# Patient Record
Sex: Female | Born: 2000 | Race: White | Hispanic: No | Marital: Single | Smoking: Never smoker
Health system: Southern US, Community
[De-identification: ages and names within clinical notes are randomized; demographics above are authoritative.]

## PROBLEM LIST (undated history)

## (undated) DIAGNOSIS — T7840XA Allergy, unspecified, initial encounter: Secondary | ICD-10-CM

## (undated) DIAGNOSIS — J45909 Unspecified asthma, uncomplicated: Secondary | ICD-10-CM

## (undated) DIAGNOSIS — L309 Dermatitis, unspecified: Secondary | ICD-10-CM

## (undated) DIAGNOSIS — Z9229 Personal history of other drug therapy: Secondary | ICD-10-CM

## (undated) DIAGNOSIS — R064 Hyperventilation: Secondary | ICD-10-CM

## (undated) HISTORY — DX: Personal history of other drug therapy: Z92.29

## (undated) HISTORY — DX: Dermatitis, unspecified: L30.9

## (undated) HISTORY — DX: Allergy, unspecified, initial encounter: T78.40XA

## (undated) HISTORY — DX: Hyperventilation: R06.4

## (undated) HISTORY — PX: WISDOM TOOTH EXTRACTION: SHX21

## (undated) HISTORY — DX: Unspecified asthma, uncomplicated: J45.909

---
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
## 2019-01-04 ENCOUNTER — Other Ambulatory Visit: Payer: Self-pay | Admitting: Surgery

## 2019-01-04 DIAGNOSIS — E041 Nontoxic single thyroid nodule: Secondary | ICD-10-CM

## 2019-01-14 ENCOUNTER — Other Ambulatory Visit (HOSPITAL_COMMUNITY)
Admission: RE | Admit: 2019-01-14 | Discharge: 2019-01-14 | Disposition: A | Discharge status: Home / Self Care | Payer: Medicaid Other | Source: Ambulatory Visit | Attending: Student | Admitting: Student

## 2019-01-14 ENCOUNTER — Ambulatory Visit
Admission: RE | Admit: 2019-01-14 | Discharge: 2019-01-14 | Disposition: A | Discharge status: Home / Self Care | Payer: Medicaid Other | Source: Ambulatory Visit | Attending: Surgery | Admitting: Surgery

## 2019-01-14 DIAGNOSIS — E041 Nontoxic single thyroid nodule: Secondary | ICD-10-CM | POA: Insufficient documentation

## 2019-02-05 ENCOUNTER — Other Ambulatory Visit: Payer: Self-pay | Admitting: Pediatrics

## 2019-02-05 DIAGNOSIS — R221 Localized swelling, mass and lump, neck: Secondary | ICD-10-CM

## 2019-02-05 DIAGNOSIS — E041 Nontoxic single thyroid nodule: Secondary | ICD-10-CM

## 2019-02-08 ENCOUNTER — Other Ambulatory Visit: Payer: Self-pay

## 2019-02-08 ENCOUNTER — Ambulatory Visit
Admission: RE | Admit: 2019-02-08 | Discharge: 2019-02-08 | Disposition: A | Discharge status: Home / Self Care | Payer: Medicaid Other | Source: Ambulatory Visit | Attending: Pediatrics | Admitting: Pediatrics

## 2019-02-08 DIAGNOSIS — E041 Nontoxic single thyroid nodule: Secondary | ICD-10-CM

## 2019-03-11 DIAGNOSIS — E079 Disorder of thyroid, unspecified: Secondary | ICD-10-CM | POA: Insufficient documentation

## 2019-03-11 IMAGING — US US THYROID
1 series · 13 of 25 positions shown · non-contrast
Comparison: None.

CLINICAL DATA: Right neck swelling

EXAM:
THYROID ULTRASOUND
TECHNIQUE: Ultrasound examination of the thyroid gland and adjacent soft
tissues was performed.

[Series 1: us thyroid · 0.04mm/px · 13 of 44 slices shown]
[im 1/44]
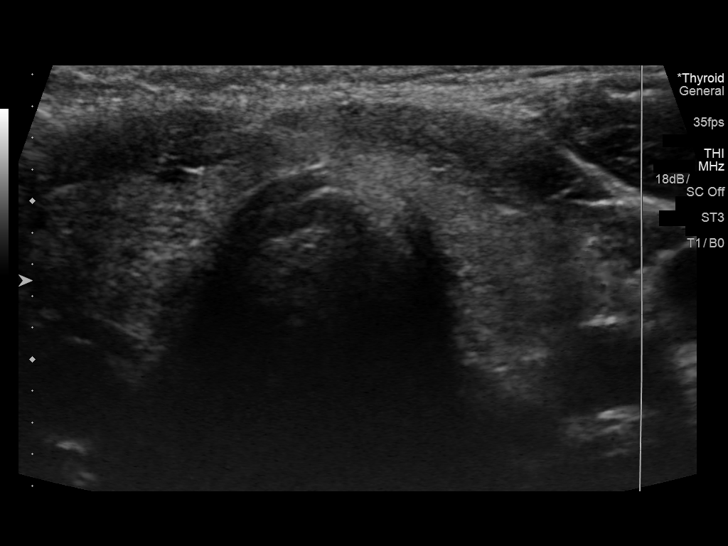
[im 4/44]
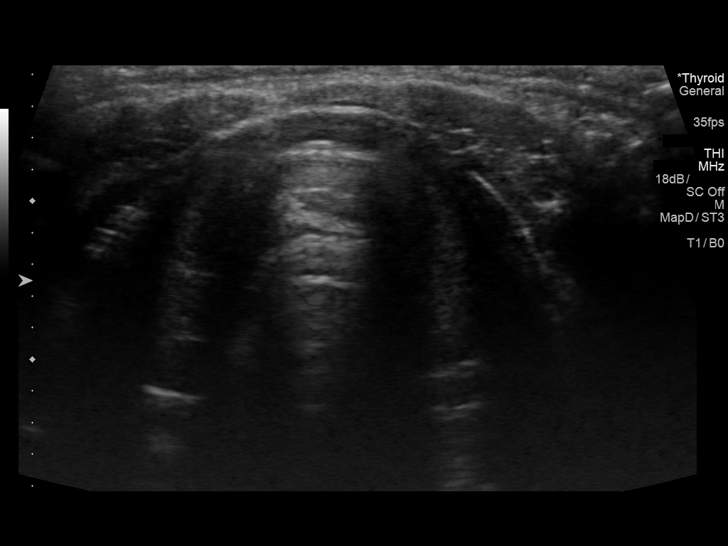
[im 8/44]
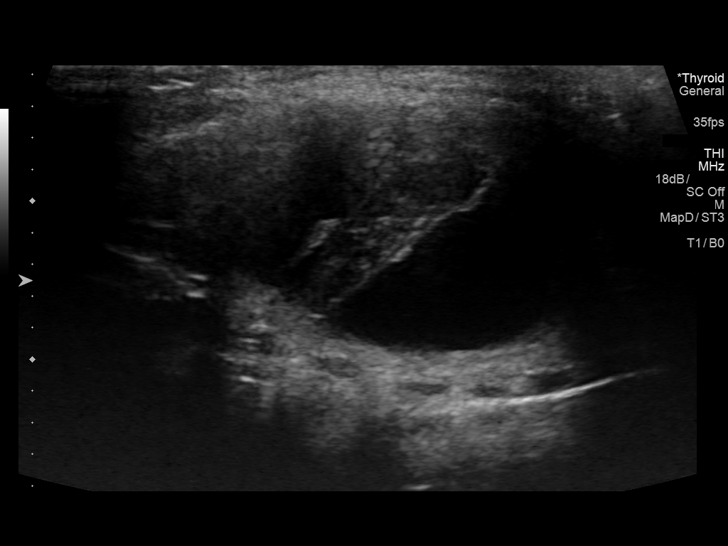
[im 11/44]
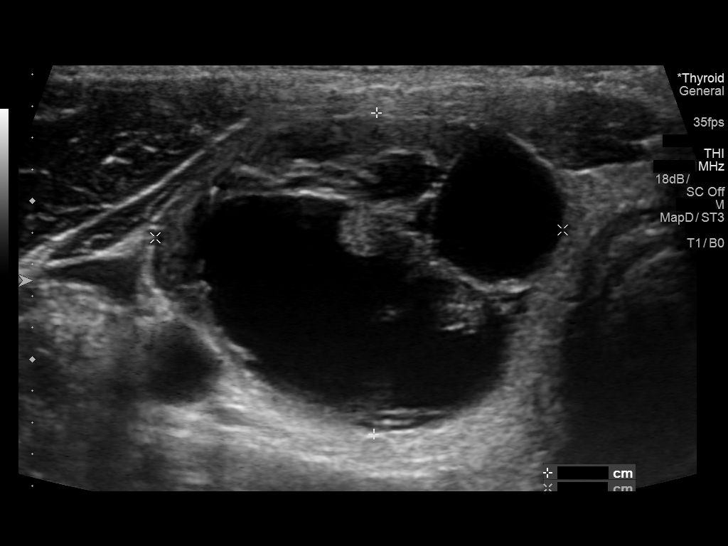
[im 15/44]
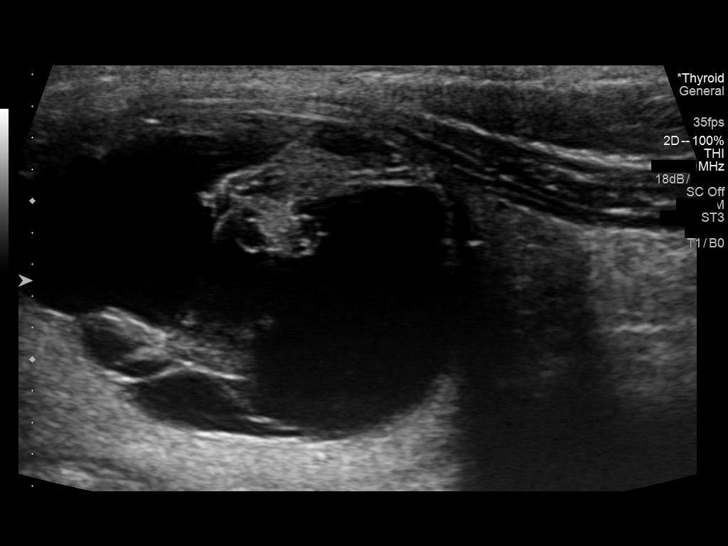
[im 18/44]
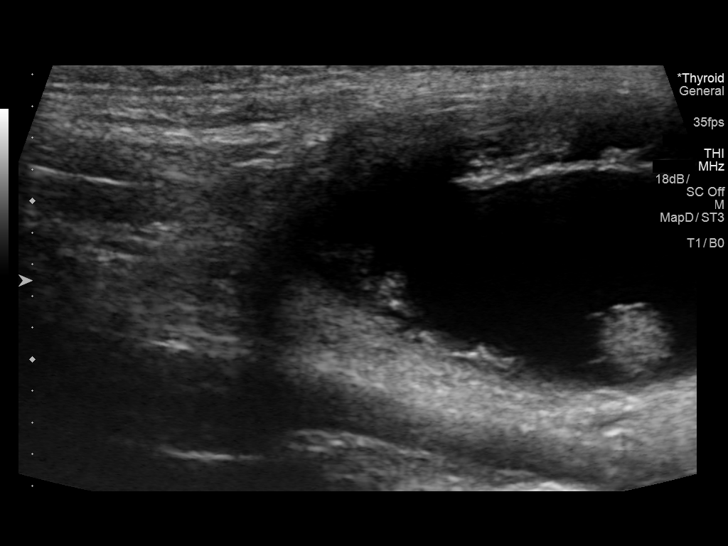
[im 22/44]
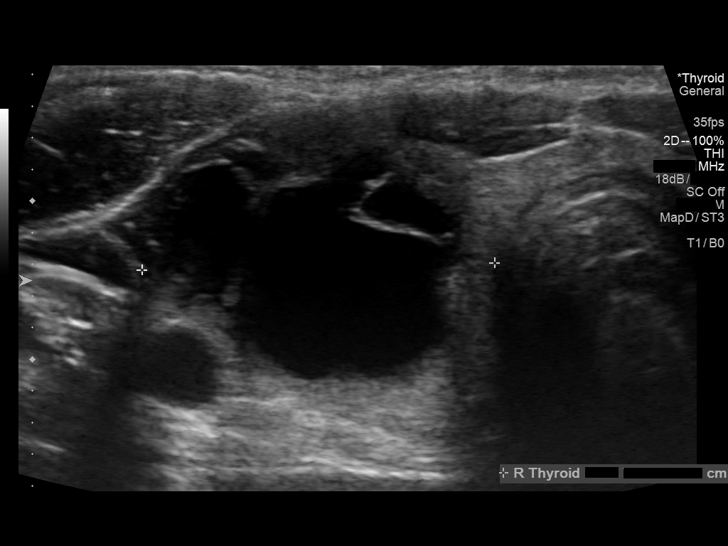
[im 26/44]
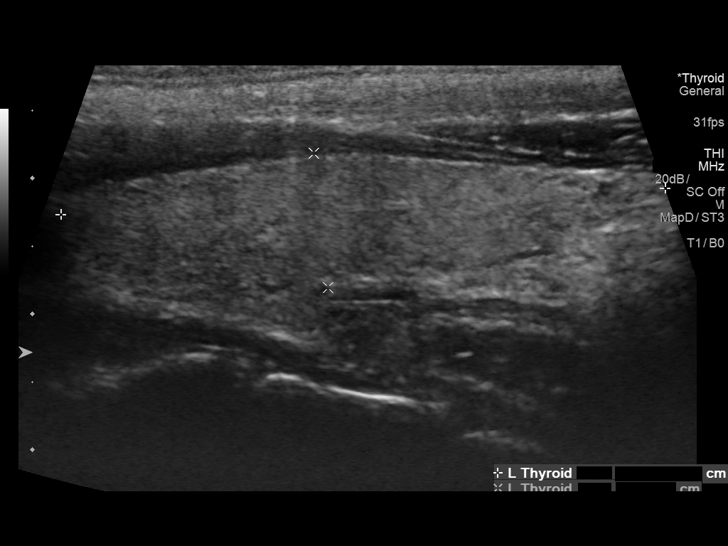
[im 29/44]
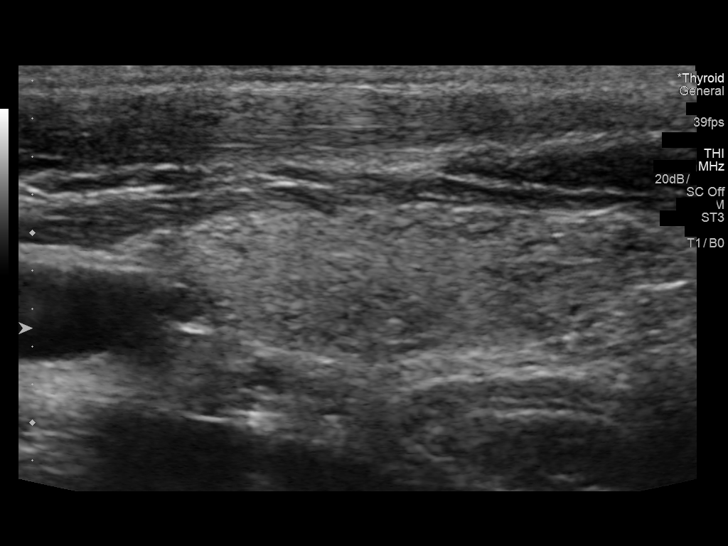
[im 33/44]
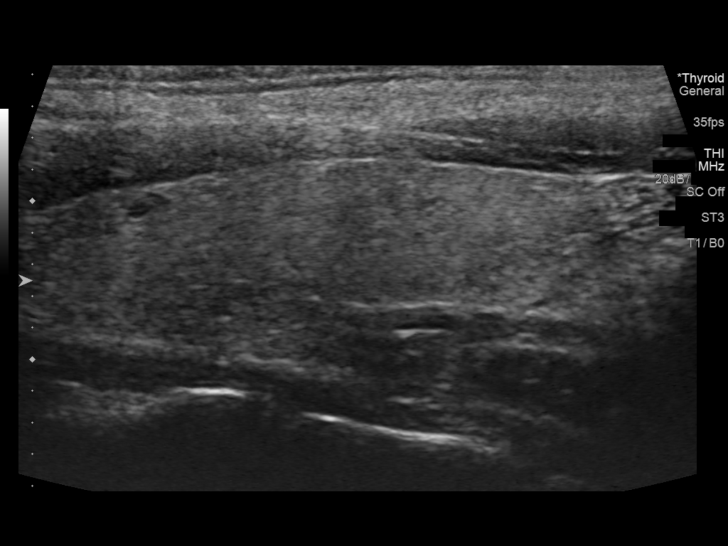
[im 36/44]
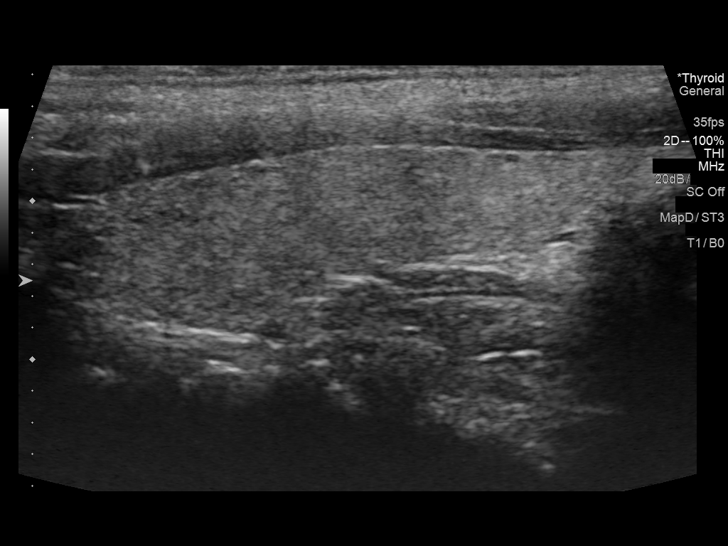
[im 40/44]
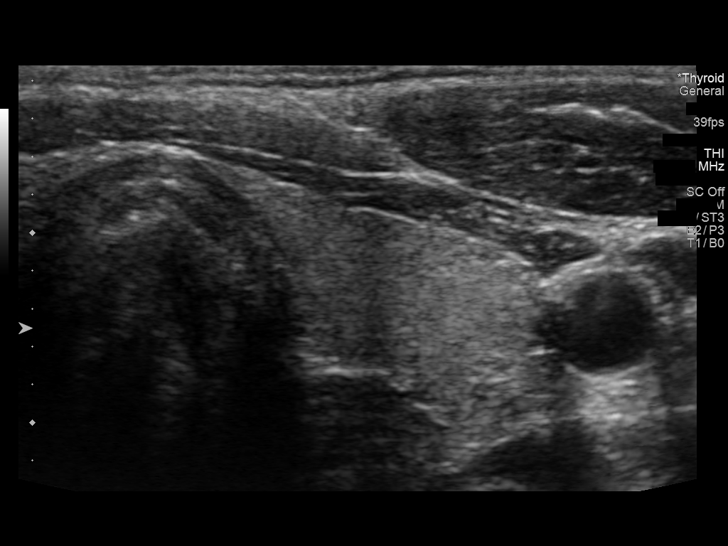
[im 44/44]
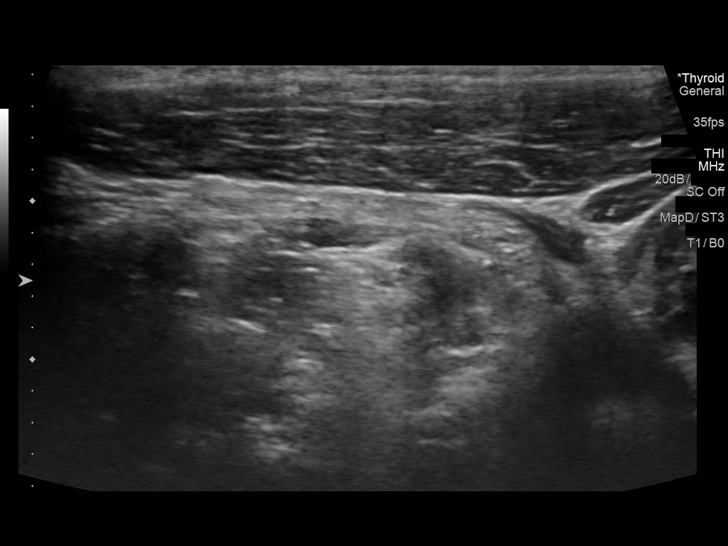

[13 of 25 positions shown; findings below may reference images not displayed]

FINDINGS: Parenchymal Echotexture: Mildly heterogenous

Isthmus: 0.2 cm thickness

Right lobe: 4.9 x 2 x 2.2 cm

Left lobe: 4.5 x 1 x 1.3 cm

_________________________________________________________

Estimated total number of nodules >/= 1 cm: 1

Number of spongiform nodules >/=  2 cm not described below (TR1): 0

Number of mixed cystic and solid nodules >/= 1.5 cm not described
below (TR2): 0

_________________________________________________________

Nodule # 1:

Location: Right; Mid

Maximum size: 3.1 cm; Other 2 dimensions: 2.6 x 2 cm

Composition: mixed cystic and solid (1)

Echogenicity: hypoechoic (2)

Shape: not taller-than-wide (0)

Margins: smooth (0)

Echogenic foci: none (0)

ACR TI-RADS total points: 3.

ACR TI-RADS risk category: TR3 (3 points).

ACR TI-RADS recommendations:

**Given size (>/= 2.5 cm) and appearance, fine needle aspiration of
this mildly suspicious nodule should be considered based on TI-RADS
criteria.

_________________________________________________________
IMPRESSION: 1. Solitary mildly suspicious 3.1 cm right mid cystic/solid lesion.
Recommend FNA biopsy.

The above is in keeping with the ACR TI-RADS recommendations - [HOSPITAL] 4874;[DATE].

## 2019-04-04 IMAGING — US ULTRASOUND FNA BIOPSY THYROID 1ST LESION
1 series · 13 of 14 positions shown · non-contrast
Comparison: US THYROID 12/21/2018

MEDICATIONS:
1 mL 1% lidocaine

COMPLICATIONS:
None immediate.

INDICATION: Indeterminate thyroid nodule of the right mid thyroid. Request is
made for fine-needle aspiration.

EXAM:
ULTRASOUND GUIDED FINE NEEDLE ASPIRATION OF INDETERMINATE THYROID
NODULE
TECHNIQUE: Informed written consent was obtained from the patient after a
discussion of the risks, benefits and alternatives to treatment.
Questions regarding the procedure were encouraged and answered. A
timeout was performed prior to the initiation of the procedure.

[Series 1: ultrasound fna biopsy thyroid 1st lesion · 0.06mm/px · 14 acquisitions, 13 frames shown]
[im 1/14]
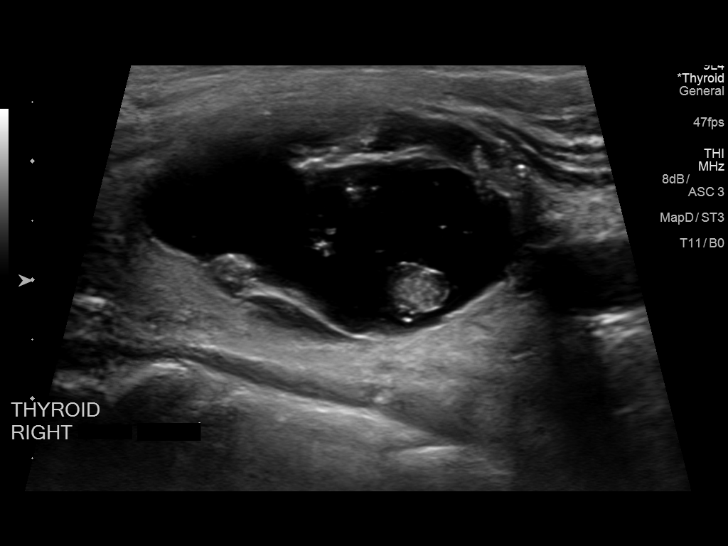
[im 2/14]
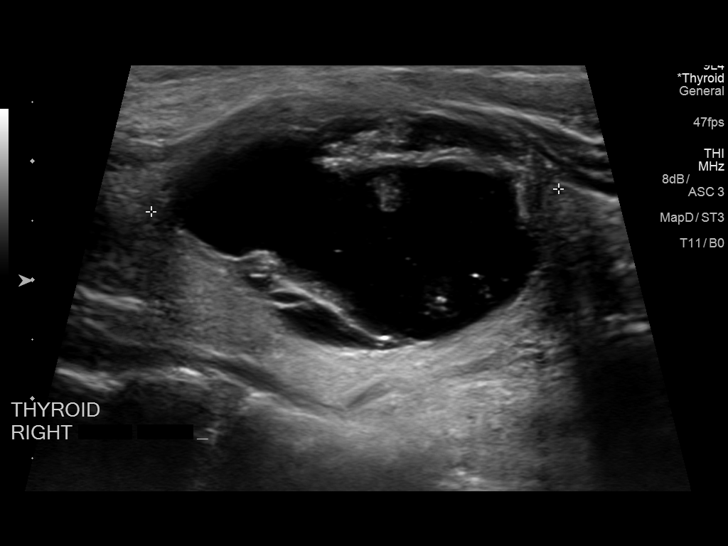
[im 3/14]
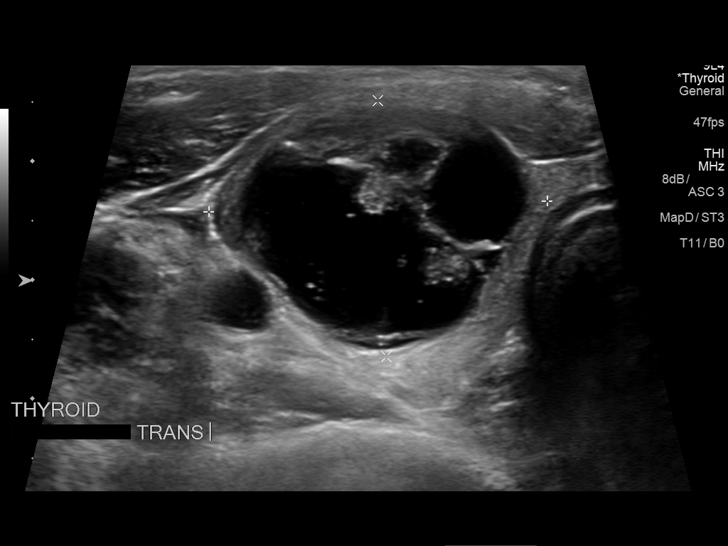
[im 4/14]
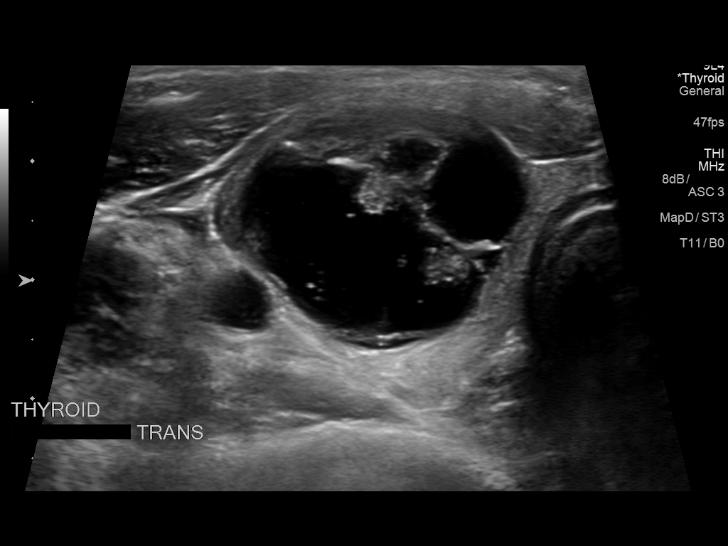
[im 5/14]
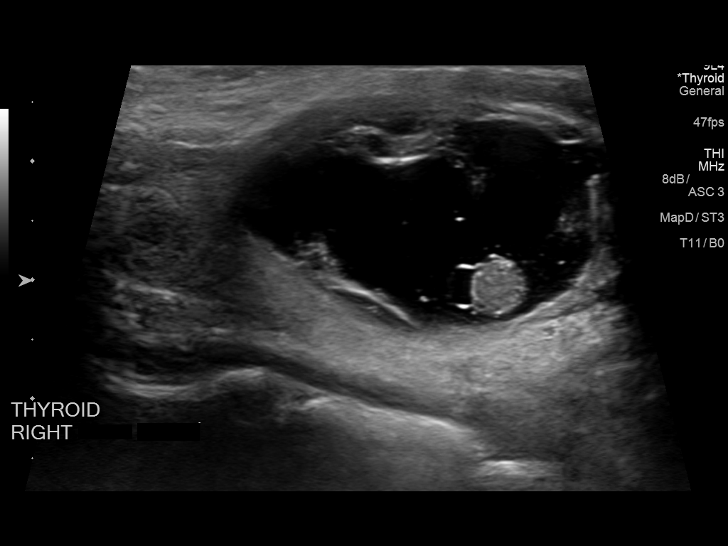
[im 6/14]
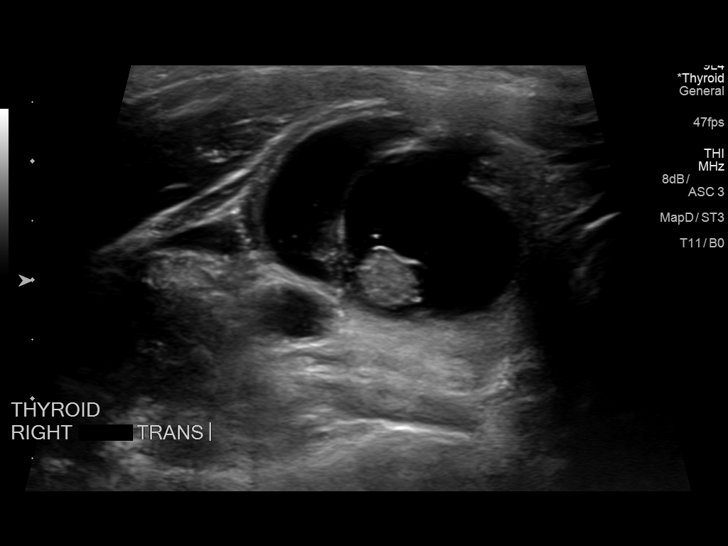
[im 8/14]
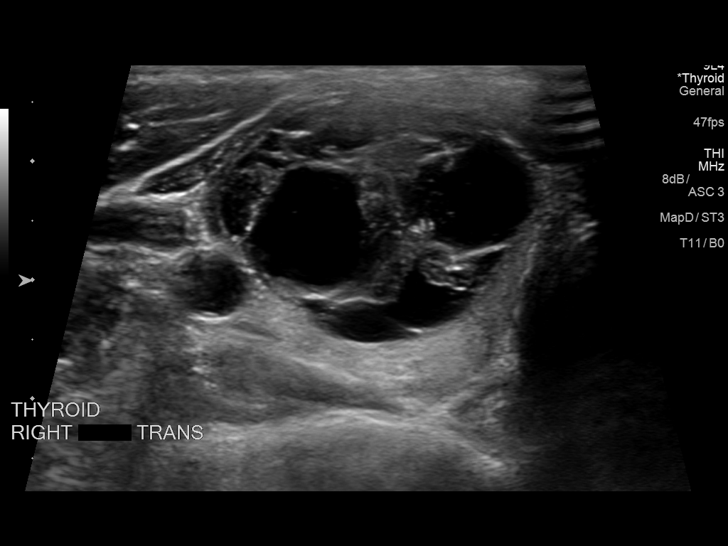
[im 9/14]
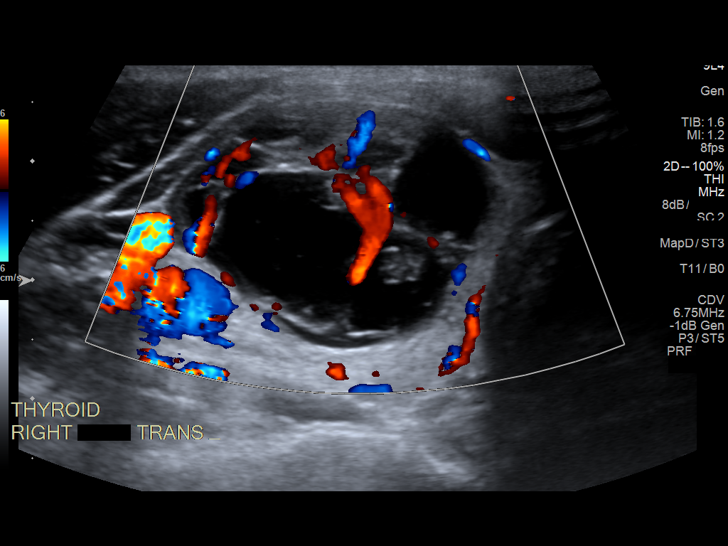
[im 10/14]
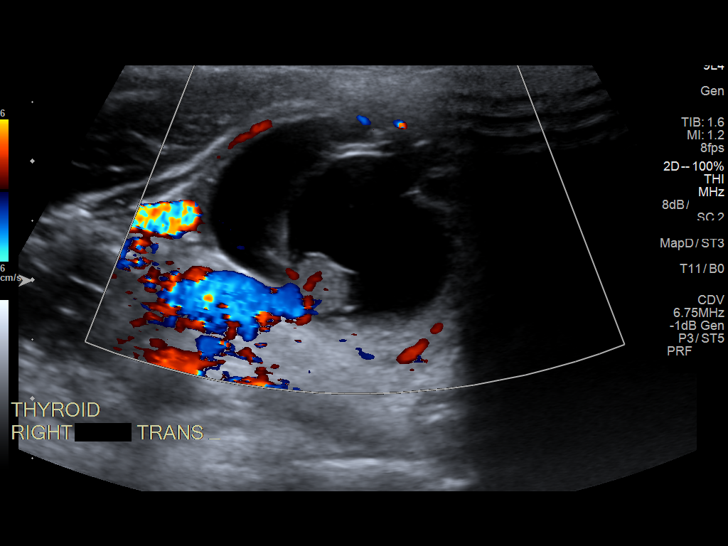
[im 11/14]
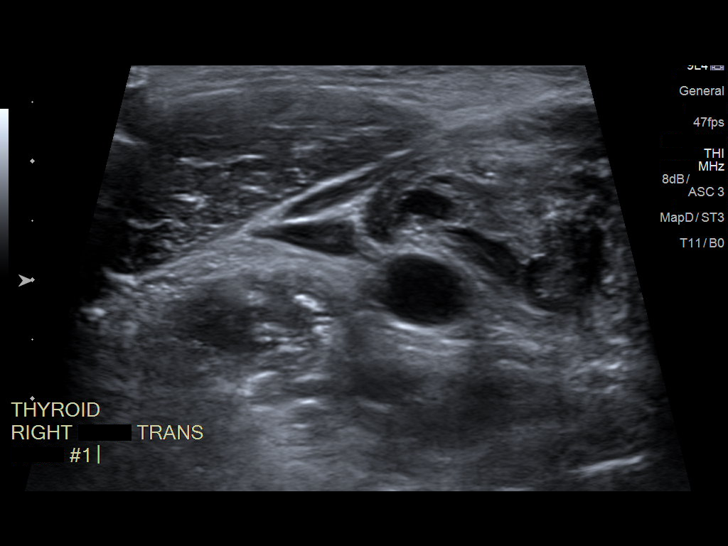
[im 12/14]
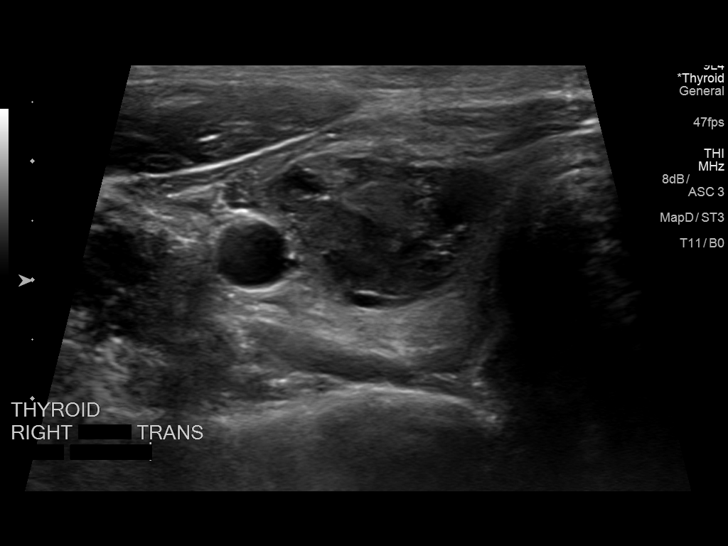
[im 13/14]
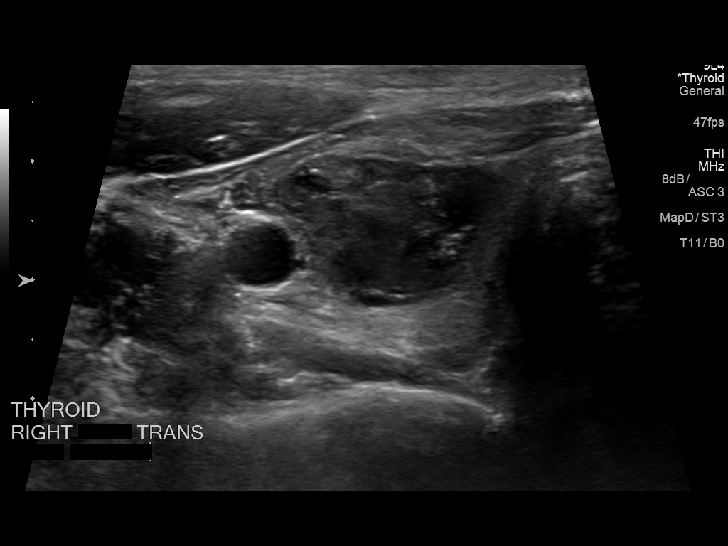
[im 14/14]
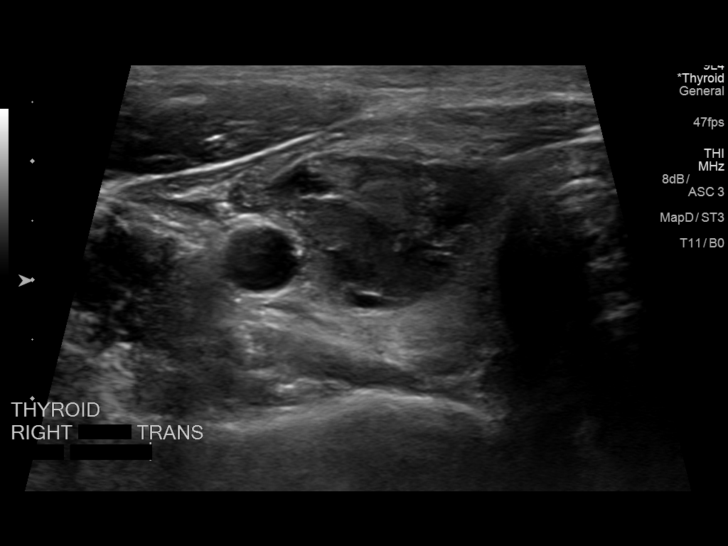

[13 of 14 positions shown; findings below may reference images not displayed]

Pre-procedural ultrasound scanning demonstrated mostly cystic nodule
of the right mid thyroid amenable to aspiration.

The procedure was planned. The neck was prepped in the usual sterile
fashion, and a sterile drape was applied covering the operative
field. A timeout was performed prior to the initiation of the
procedure. Local anesthesia was provided with 1% lidocaine.

Under direct ultrasound guidance, fine-needle aspiration was
performed with an 18 gauge needle yielding dark bloody fluid.
Multiple ultrasound images were saved for procedural documentation
purposes. The samples were prepared and submitted for cytology.

Limited post procedural scanning was negative for hematoma or
additional complication. Dressings were placed. The patient
tolerated the above procedures procedure well without immediate
postprocedural complication.
FINDINGS: Preprocedural ultrasound shows mostly cystic nodule amenable to
fine-needle aspiration yielding 6 mL of dark bloody fluid. After
discussion with Dr. Krbel [place], sample was sent for cytology only.
IMPRESSION: Technically successful ultrasound guided fine needle aspiration of
cystic thyroid nodule.

## 2019-04-29 IMAGING — US US THYROID
1 series · 13 of 25 positions shown · non-contrast
Comparison: 12/21/2018

CLINICAL DATA: Prior ultrasound follow-up. History of solid and
cystic nodule in the mid right lobe which was aspirated on
01/14/2019 demonstrating findings consistent with a cyst. The
patient states that the region is becoming enlarged and tender.

EXAM:
THYROID ULTRASOUND
TECHNIQUE: Ultrasound examination of the thyroid gland and adjacent soft
tissues was performed.

[Series 1: us thyroid · 0.04mm/px · 13 of 50 slices shown]
[im 1/50]
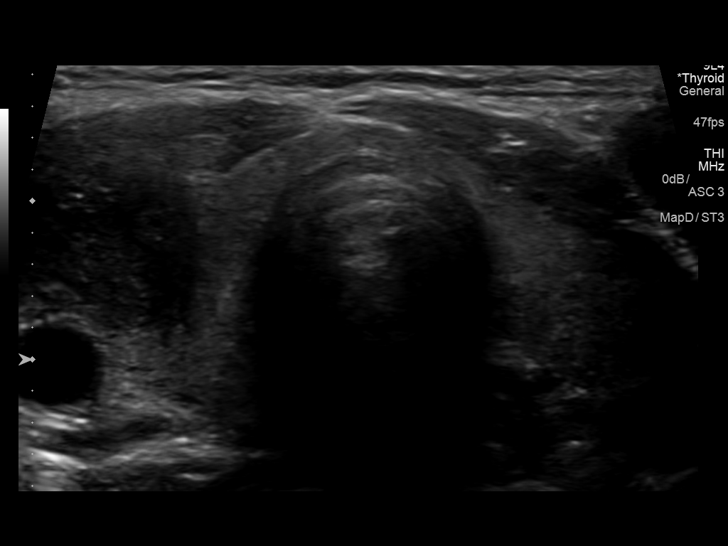
[im 5/50]
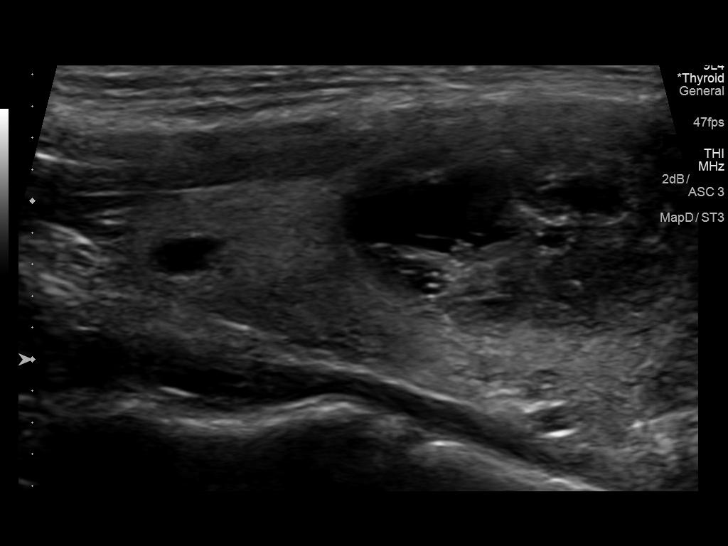
[im 9/50]
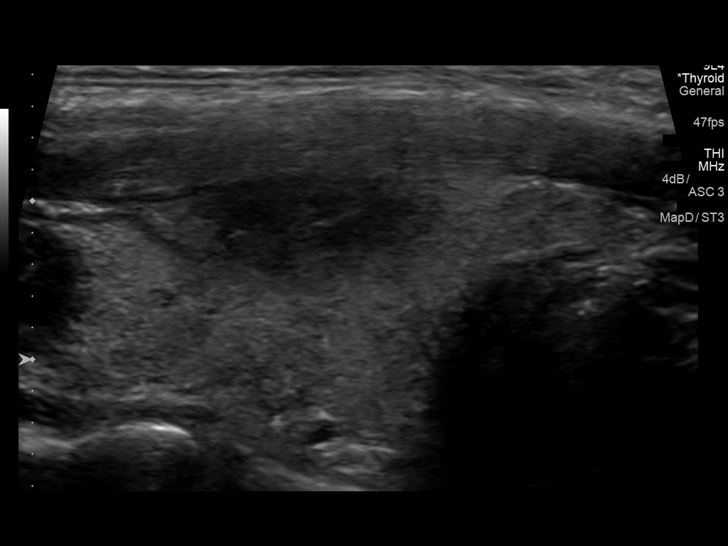
[im 13/50]
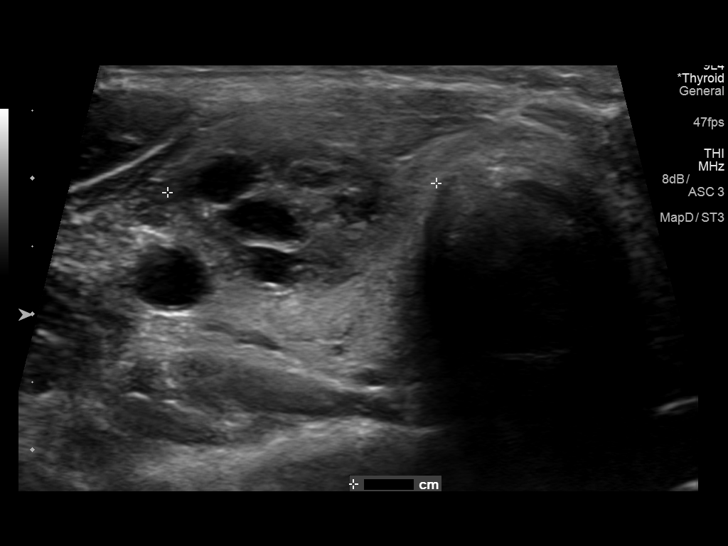
[im 17/50]
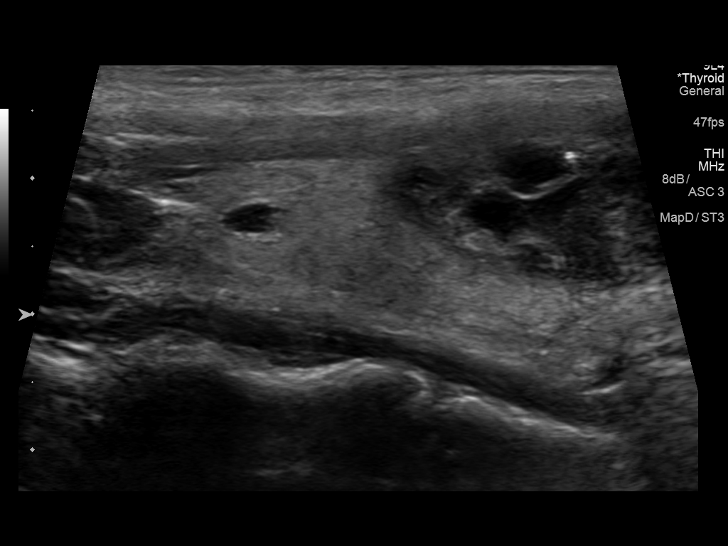
[im 21/50]
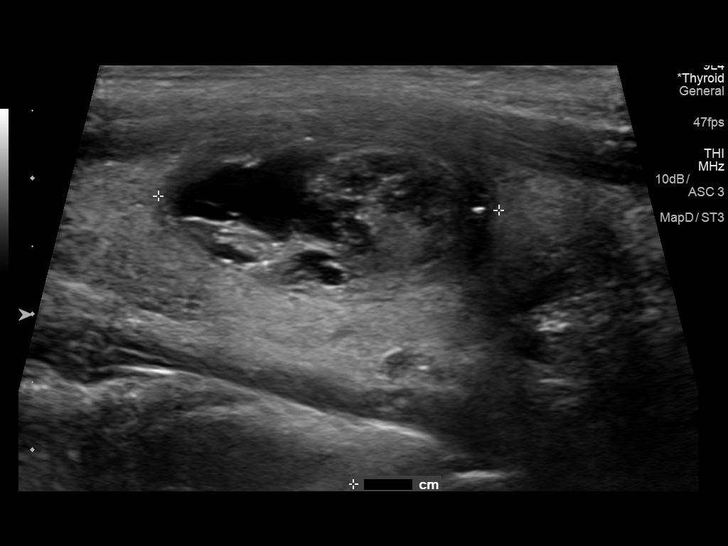
[im 25/50]
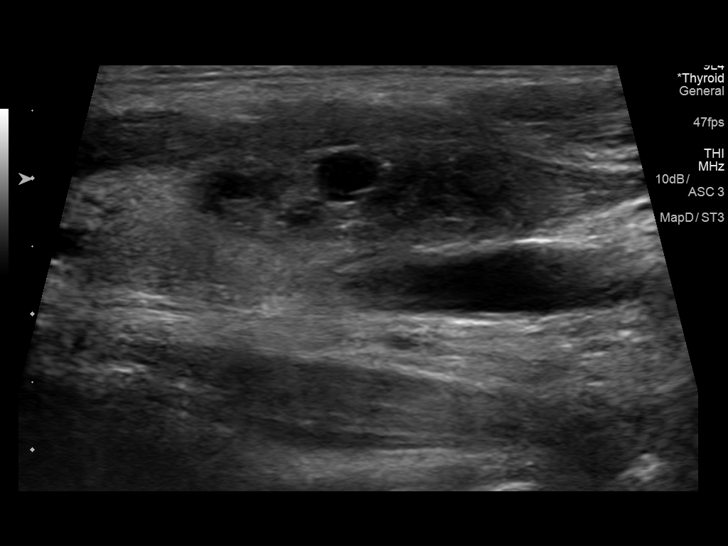
[im 29/50]
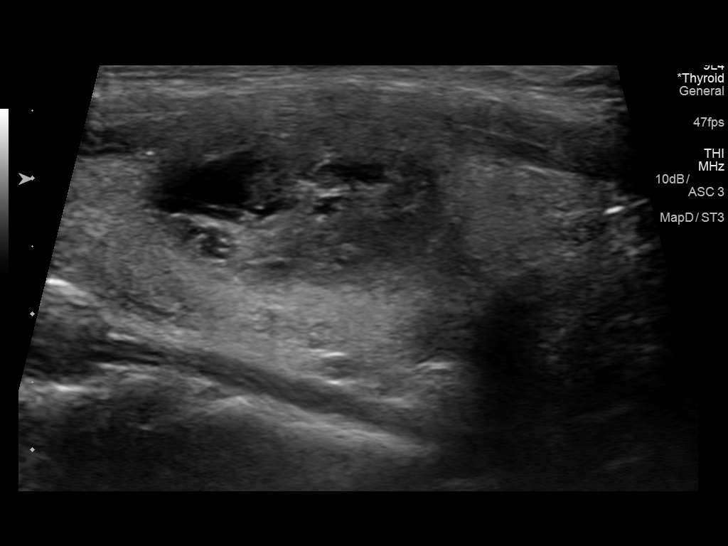
[im 33/50]
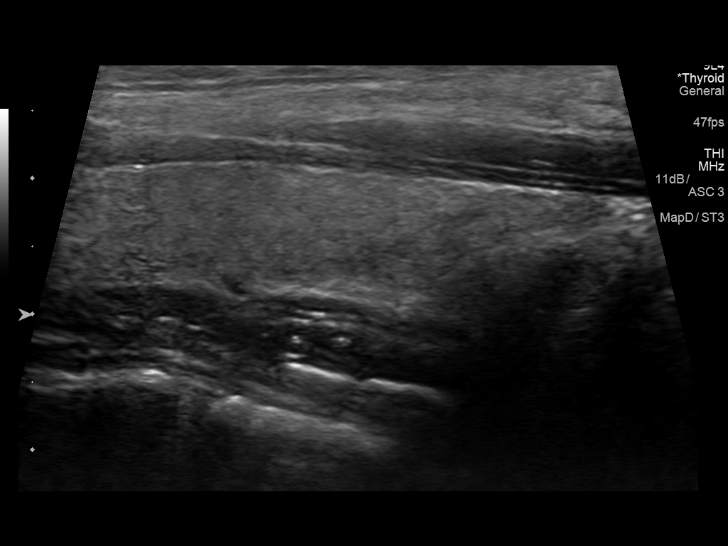
[im 37/50]
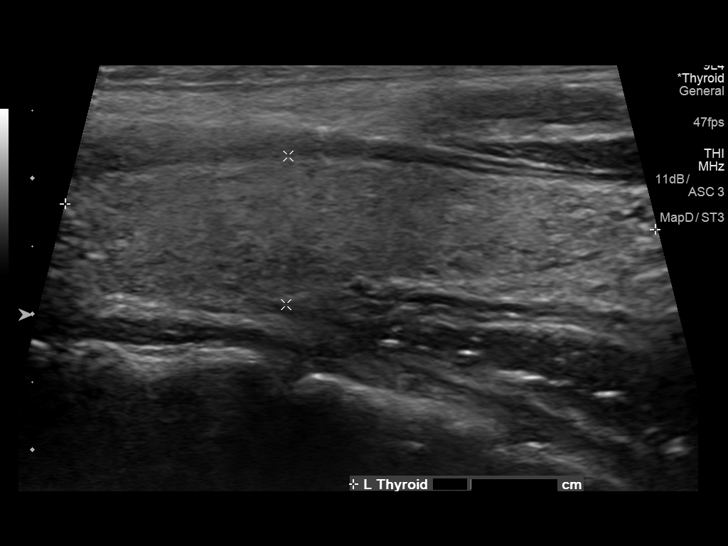
[im 41/50]
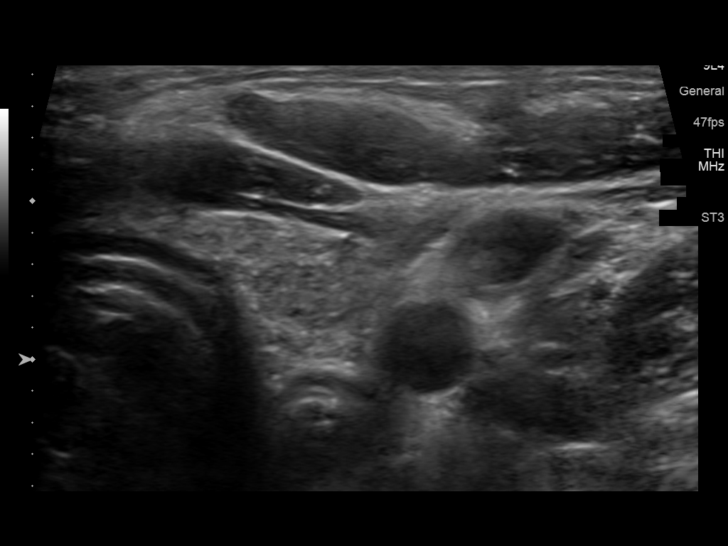
[im 45/50]
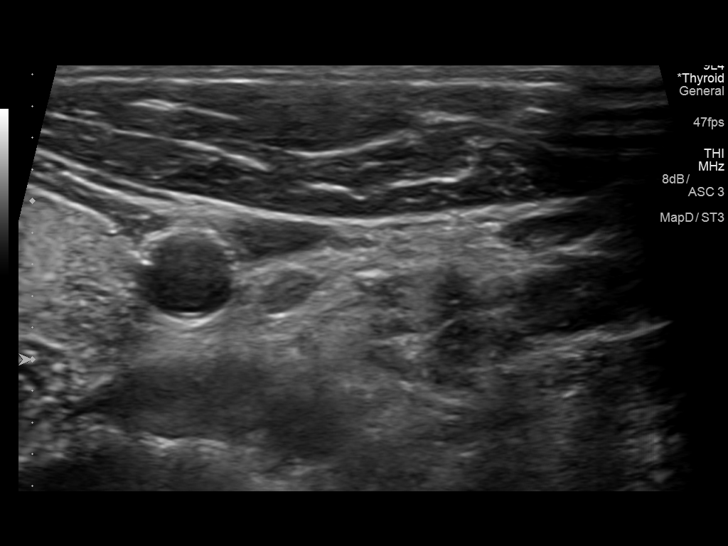
[im 50/50]
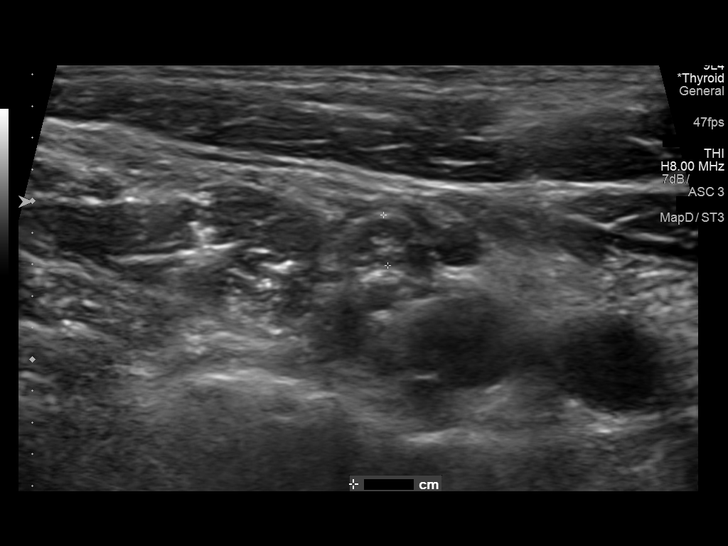

[13 of 25 positions shown; findings below may reference images not displayed]

FINDINGS: Parenchymal Echotexture: Normal

Isthmus: 0.3 cm

Right lobe: 4.9 x 1.7 x 2.0 cm

Left lobe: 4.4 x 1.1 x 1.3 cm

_________________________________________________________

Estimated total number of nodules >/= 1 cm: 1

Number of spongiform nodules >/=  2 cm not described below (TR1): 0

Number of mixed cystic and solid nodules >/= 1.5 cm not described
below (TR2): 0

_________________________________________________________

Nodule # :

Prior biopsy: Yes

Location: Right; Mid

Maximum size: 2.5 cm; Other 2 dimensions: 1.8 x 1.2 cm, previously,
3.4 cm

Composition: mixed cystic and solid (1)

Echogenicity: isoechoic (1)

Shape: not taller-than-wide (0)

Margins: smooth (0)

Echogenic foci: none (0)

ACR TI-RADS total points: 2.

ACR TI-RADS risk category:  TR2 (2 points).

Significant change in size (>/= 20% in two dimensions and minimal
increase of 2 mm): No

Change in features: No

Change in ACR TI-RADS risk category: No

ACR TI-RADS recommendations:

This nodule does NOT meet TI-RADS criteria for biopsy or dedicated
follow-up.

_________________________________________________________

No abnormal lymph nodes identified.
IMPRESSION: Decrease in size of the solid and cystic nodule in the right lobe
after prior fine-needle aspiration. The nodule does not meet
criteria for biopsy or further follow-up.

The above is in keeping with the ACR TI-RADS recommendations - [HOSPITAL] 3899;[DATE].

## 2019-10-04 ENCOUNTER — Other Ambulatory Visit: Payer: Self-pay

## 2019-10-04 DIAGNOSIS — Z20822 Contact with and (suspected) exposure to covid-19: Secondary | ICD-10-CM

## 2019-10-05 LAB — NOVEL CORONAVIRUS, NAA: SARS-CoV-2, NAA: NOT DETECTED

## 2020-07-11 DIAGNOSIS — Z20828 Contact with and (suspected) exposure to other viral communicable diseases: Secondary | ICD-10-CM | POA: Diagnosis not present

## 2020-07-13 DIAGNOSIS — J3081 Allergic rhinitis due to animal (cat) (dog) hair and dander: Secondary | ICD-10-CM | POA: Diagnosis not present

## 2020-07-13 DIAGNOSIS — J301 Allergic rhinitis due to pollen: Secondary | ICD-10-CM | POA: Diagnosis not present

## 2020-07-13 DIAGNOSIS — J302 Other seasonal allergic rhinitis: Secondary | ICD-10-CM | POA: Diagnosis not present

## 2020-07-13 DIAGNOSIS — J3089 Other allergic rhinitis: Secondary | ICD-10-CM | POA: Diagnosis not present

## 2020-07-24 DIAGNOSIS — J3081 Allergic rhinitis due to animal (cat) (dog) hair and dander: Secondary | ICD-10-CM | POA: Diagnosis not present

## 2020-07-24 DIAGNOSIS — J301 Allergic rhinitis due to pollen: Secondary | ICD-10-CM | POA: Diagnosis not present

## 2020-07-24 DIAGNOSIS — J302 Other seasonal allergic rhinitis: Secondary | ICD-10-CM | POA: Diagnosis not present

## 2020-07-24 DIAGNOSIS — J3089 Other allergic rhinitis: Secondary | ICD-10-CM | POA: Diagnosis not present

## 2020-08-10 DIAGNOSIS — Z20828 Contact with and (suspected) exposure to other viral communicable diseases: Secondary | ICD-10-CM | POA: Diagnosis not present

## 2020-08-14 DIAGNOSIS — U071 COVID-19: Secondary | ICD-10-CM | POA: Diagnosis not present

## 2020-08-14 DIAGNOSIS — R059 Cough, unspecified: Secondary | ICD-10-CM | POA: Diagnosis not present

## 2020-08-14 DIAGNOSIS — J029 Acute pharyngitis, unspecified: Secondary | ICD-10-CM | POA: Diagnosis not present

## 2020-09-05 DIAGNOSIS — J301 Allergic rhinitis due to pollen: Secondary | ICD-10-CM | POA: Diagnosis not present

## 2020-09-05 DIAGNOSIS — J3081 Allergic rhinitis due to animal (cat) (dog) hair and dander: Secondary | ICD-10-CM | POA: Diagnosis not present

## 2020-09-05 DIAGNOSIS — J3089 Other allergic rhinitis: Secondary | ICD-10-CM | POA: Diagnosis not present

## 2020-09-05 DIAGNOSIS — J302 Other seasonal allergic rhinitis: Secondary | ICD-10-CM | POA: Diagnosis not present

## 2020-09-08 DIAGNOSIS — J3089 Other allergic rhinitis: Secondary | ICD-10-CM | POA: Diagnosis not present

## 2020-09-08 DIAGNOSIS — J301 Allergic rhinitis due to pollen: Secondary | ICD-10-CM | POA: Diagnosis not present

## 2020-09-08 DIAGNOSIS — J3081 Allergic rhinitis due to animal (cat) (dog) hair and dander: Secondary | ICD-10-CM | POA: Diagnosis not present

## 2020-09-08 DIAGNOSIS — J302 Other seasonal allergic rhinitis: Secondary | ICD-10-CM | POA: Diagnosis not present

## 2020-09-15 DIAGNOSIS — J302 Other seasonal allergic rhinitis: Secondary | ICD-10-CM | POA: Diagnosis not present

## 2020-09-15 DIAGNOSIS — J301 Allergic rhinitis due to pollen: Secondary | ICD-10-CM | POA: Diagnosis not present

## 2020-09-15 DIAGNOSIS — J3081 Allergic rhinitis due to animal (cat) (dog) hair and dander: Secondary | ICD-10-CM | POA: Diagnosis not present

## 2020-09-15 DIAGNOSIS — J3089 Other allergic rhinitis: Secondary | ICD-10-CM | POA: Diagnosis not present

## 2020-10-04 DIAGNOSIS — J301 Allergic rhinitis due to pollen: Secondary | ICD-10-CM | POA: Diagnosis not present

## 2020-10-04 DIAGNOSIS — J3081 Allergic rhinitis due to animal (cat) (dog) hair and dander: Secondary | ICD-10-CM | POA: Diagnosis not present

## 2020-10-04 DIAGNOSIS — J3089 Other allergic rhinitis: Secondary | ICD-10-CM | POA: Diagnosis not present

## 2020-10-04 DIAGNOSIS — J302 Other seasonal allergic rhinitis: Secondary | ICD-10-CM | POA: Diagnosis not present

## 2020-10-16 DIAGNOSIS — M9903 Segmental and somatic dysfunction of lumbar region: Secondary | ICD-10-CM | POA: Diagnosis not present

## 2020-10-16 DIAGNOSIS — M5382 Other specified dorsopathies, cervical region: Secondary | ICD-10-CM | POA: Diagnosis not present

## 2020-10-16 DIAGNOSIS — M9901 Segmental and somatic dysfunction of cervical region: Secondary | ICD-10-CM | POA: Diagnosis not present

## 2020-10-16 DIAGNOSIS — M5386 Other specified dorsopathies, lumbar region: Secondary | ICD-10-CM | POA: Diagnosis not present

## 2020-10-17 DIAGNOSIS — M5382 Other specified dorsopathies, cervical region: Secondary | ICD-10-CM | POA: Diagnosis not present

## 2020-10-17 DIAGNOSIS — M9901 Segmental and somatic dysfunction of cervical region: Secondary | ICD-10-CM | POA: Diagnosis not present

## 2020-10-17 DIAGNOSIS — M9903 Segmental and somatic dysfunction of lumbar region: Secondary | ICD-10-CM | POA: Diagnosis not present

## 2020-10-17 DIAGNOSIS — M5386 Other specified dorsopathies, lumbar region: Secondary | ICD-10-CM | POA: Diagnosis not present

## 2020-10-19 DIAGNOSIS — M47819 Spondylosis without myelopathy or radiculopathy, site unspecified: Secondary | ICD-10-CM | POA: Diagnosis not present

## 2020-10-19 DIAGNOSIS — M5382 Other specified dorsopathies, cervical region: Secondary | ICD-10-CM | POA: Diagnosis not present

## 2020-10-19 DIAGNOSIS — M9903 Segmental and somatic dysfunction of lumbar region: Secondary | ICD-10-CM | POA: Diagnosis not present

## 2020-10-19 DIAGNOSIS — M5032 Other cervical disc degeneration, mid-cervical region, unspecified level: Secondary | ICD-10-CM | POA: Diagnosis not present

## 2020-10-19 DIAGNOSIS — M5386 Other specified dorsopathies, lumbar region: Secondary | ICD-10-CM | POA: Diagnosis not present

## 2020-10-19 DIAGNOSIS — M9901 Segmental and somatic dysfunction of cervical region: Secondary | ICD-10-CM | POA: Diagnosis not present

## 2020-10-23 DIAGNOSIS — M5382 Other specified dorsopathies, cervical region: Secondary | ICD-10-CM | POA: Diagnosis not present

## 2020-10-23 DIAGNOSIS — M5386 Other specified dorsopathies, lumbar region: Secondary | ICD-10-CM | POA: Diagnosis not present

## 2020-10-23 DIAGNOSIS — M9903 Segmental and somatic dysfunction of lumbar region: Secondary | ICD-10-CM | POA: Diagnosis not present

## 2020-10-23 DIAGNOSIS — M9901 Segmental and somatic dysfunction of cervical region: Secondary | ICD-10-CM | POA: Diagnosis not present

## 2020-10-25 DIAGNOSIS — M9901 Segmental and somatic dysfunction of cervical region: Secondary | ICD-10-CM | POA: Diagnosis not present

## 2020-10-25 DIAGNOSIS — M5386 Other specified dorsopathies, lumbar region: Secondary | ICD-10-CM | POA: Diagnosis not present

## 2020-10-25 DIAGNOSIS — M5382 Other specified dorsopathies, cervical region: Secondary | ICD-10-CM | POA: Diagnosis not present

## 2020-10-25 DIAGNOSIS — M9903 Segmental and somatic dysfunction of lumbar region: Secondary | ICD-10-CM | POA: Diagnosis not present

## 2020-10-30 DIAGNOSIS — M9901 Segmental and somatic dysfunction of cervical region: Secondary | ICD-10-CM | POA: Diagnosis not present

## 2020-10-30 DIAGNOSIS — M9903 Segmental and somatic dysfunction of lumbar region: Secondary | ICD-10-CM | POA: Diagnosis not present

## 2020-10-30 DIAGNOSIS — M5382 Other specified dorsopathies, cervical region: Secondary | ICD-10-CM | POA: Diagnosis not present

## 2020-10-30 DIAGNOSIS — M5386 Other specified dorsopathies, lumbar region: Secondary | ICD-10-CM | POA: Diagnosis not present

## 2020-10-31 DIAGNOSIS — E041 Nontoxic single thyroid nodule: Secondary | ICD-10-CM | POA: Diagnosis not present

## 2020-11-01 DIAGNOSIS — M5386 Other specified dorsopathies, lumbar region: Secondary | ICD-10-CM | POA: Diagnosis not present

## 2020-11-01 DIAGNOSIS — M9903 Segmental and somatic dysfunction of lumbar region: Secondary | ICD-10-CM | POA: Diagnosis not present

## 2020-11-01 DIAGNOSIS — M5382 Other specified dorsopathies, cervical region: Secondary | ICD-10-CM | POA: Diagnosis not present

## 2020-11-01 DIAGNOSIS — M9901 Segmental and somatic dysfunction of cervical region: Secondary | ICD-10-CM | POA: Diagnosis not present

## 2020-11-08 DIAGNOSIS — E079 Disorder of thyroid, unspecified: Secondary | ICD-10-CM | POA: Diagnosis not present

## 2020-11-08 DIAGNOSIS — M9903 Segmental and somatic dysfunction of lumbar region: Secondary | ICD-10-CM | POA: Diagnosis not present

## 2020-11-08 DIAGNOSIS — M5382 Other specified dorsopathies, cervical region: Secondary | ICD-10-CM | POA: Diagnosis not present

## 2020-11-08 DIAGNOSIS — M5386 Other specified dorsopathies, lumbar region: Secondary | ICD-10-CM | POA: Diagnosis not present

## 2020-11-08 DIAGNOSIS — M9901 Segmental and somatic dysfunction of cervical region: Secondary | ICD-10-CM | POA: Diagnosis not present

## 2020-11-09 DIAGNOSIS — M5382 Other specified dorsopathies, cervical region: Secondary | ICD-10-CM | POA: Diagnosis not present

## 2020-11-09 DIAGNOSIS — M5386 Other specified dorsopathies, lumbar region: Secondary | ICD-10-CM | POA: Diagnosis not present

## 2020-11-09 DIAGNOSIS — M9901 Segmental and somatic dysfunction of cervical region: Secondary | ICD-10-CM | POA: Diagnosis not present

## 2020-11-09 DIAGNOSIS — M9903 Segmental and somatic dysfunction of lumbar region: Secondary | ICD-10-CM | POA: Diagnosis not present

## 2020-11-27 DIAGNOSIS — J3081 Allergic rhinitis due to animal (cat) (dog) hair and dander: Secondary | ICD-10-CM | POA: Diagnosis not present

## 2020-11-27 DIAGNOSIS — J302 Other seasonal allergic rhinitis: Secondary | ICD-10-CM | POA: Diagnosis not present

## 2020-11-27 DIAGNOSIS — J301 Allergic rhinitis due to pollen: Secondary | ICD-10-CM | POA: Diagnosis not present

## 2020-11-27 DIAGNOSIS — J3089 Other allergic rhinitis: Secondary | ICD-10-CM | POA: Diagnosis not present

## 2020-12-01 DIAGNOSIS — M25551 Pain in right hip: Secondary | ICD-10-CM | POA: Diagnosis not present

## 2020-12-01 DIAGNOSIS — M79601 Pain in right arm: Secondary | ICD-10-CM | POA: Diagnosis not present

## 2020-12-11 DIAGNOSIS — J302 Other seasonal allergic rhinitis: Secondary | ICD-10-CM | POA: Diagnosis not present

## 2020-12-11 DIAGNOSIS — J3081 Allergic rhinitis due to animal (cat) (dog) hair and dander: Secondary | ICD-10-CM | POA: Diagnosis not present

## 2020-12-11 DIAGNOSIS — J3089 Other allergic rhinitis: Secondary | ICD-10-CM | POA: Diagnosis not present

## 2020-12-18 DIAGNOSIS — J301 Allergic rhinitis due to pollen: Secondary | ICD-10-CM | POA: Diagnosis not present

## 2020-12-18 DIAGNOSIS — J3089 Other allergic rhinitis: Secondary | ICD-10-CM | POA: Diagnosis not present

## 2020-12-18 DIAGNOSIS — J302 Other seasonal allergic rhinitis: Secondary | ICD-10-CM | POA: Diagnosis not present

## 2020-12-18 DIAGNOSIS — J3081 Allergic rhinitis due to animal (cat) (dog) hair and dander: Secondary | ICD-10-CM | POA: Diagnosis not present

## 2020-12-25 DIAGNOSIS — J3081 Allergic rhinitis due to animal (cat) (dog) hair and dander: Secondary | ICD-10-CM | POA: Diagnosis not present

## 2020-12-25 DIAGNOSIS — J301 Allergic rhinitis due to pollen: Secondary | ICD-10-CM | POA: Diagnosis not present

## 2020-12-25 DIAGNOSIS — J302 Other seasonal allergic rhinitis: Secondary | ICD-10-CM | POA: Diagnosis not present

## 2020-12-25 DIAGNOSIS — J3089 Other allergic rhinitis: Secondary | ICD-10-CM | POA: Diagnosis not present

## 2020-12-29 DIAGNOSIS — Z20828 Contact with and (suspected) exposure to other viral communicable diseases: Secondary | ICD-10-CM | POA: Diagnosis not present

## 2020-12-29 DIAGNOSIS — B349 Viral infection, unspecified: Secondary | ICD-10-CM | POA: Diagnosis not present

## 2021-01-15 DIAGNOSIS — J3089 Other allergic rhinitis: Secondary | ICD-10-CM | POA: Diagnosis not present

## 2021-01-15 DIAGNOSIS — J302 Other seasonal allergic rhinitis: Secondary | ICD-10-CM | POA: Diagnosis not present

## 2021-01-15 DIAGNOSIS — J3081 Allergic rhinitis due to animal (cat) (dog) hair and dander: Secondary | ICD-10-CM | POA: Diagnosis not present

## 2021-01-15 DIAGNOSIS — J301 Allergic rhinitis due to pollen: Secondary | ICD-10-CM | POA: Diagnosis not present

## 2021-01-22 DIAGNOSIS — J3089 Other allergic rhinitis: Secondary | ICD-10-CM | POA: Diagnosis not present

## 2021-01-22 DIAGNOSIS — J302 Other seasonal allergic rhinitis: Secondary | ICD-10-CM | POA: Diagnosis not present

## 2021-01-22 DIAGNOSIS — J3081 Allergic rhinitis due to animal (cat) (dog) hair and dander: Secondary | ICD-10-CM | POA: Diagnosis not present

## 2021-01-22 DIAGNOSIS — J301 Allergic rhinitis due to pollen: Secondary | ICD-10-CM | POA: Diagnosis not present

## 2021-01-29 DIAGNOSIS — J301 Allergic rhinitis due to pollen: Secondary | ICD-10-CM | POA: Diagnosis not present

## 2021-01-29 DIAGNOSIS — J3089 Other allergic rhinitis: Secondary | ICD-10-CM | POA: Diagnosis not present

## 2021-01-29 DIAGNOSIS — J302 Other seasonal allergic rhinitis: Secondary | ICD-10-CM | POA: Diagnosis not present

## 2021-01-29 DIAGNOSIS — J3081 Allergic rhinitis due to animal (cat) (dog) hair and dander: Secondary | ICD-10-CM | POA: Diagnosis not present

## 2021-01-31 DIAGNOSIS — J3081 Allergic rhinitis due to animal (cat) (dog) hair and dander: Secondary | ICD-10-CM | POA: Diagnosis not present

## 2021-01-31 DIAGNOSIS — J302 Other seasonal allergic rhinitis: Secondary | ICD-10-CM | POA: Diagnosis not present

## 2021-01-31 DIAGNOSIS — J3089 Other allergic rhinitis: Secondary | ICD-10-CM | POA: Diagnosis not present

## 2021-01-31 DIAGNOSIS — J301 Allergic rhinitis due to pollen: Secondary | ICD-10-CM | POA: Diagnosis not present

## 2021-02-05 DIAGNOSIS — J3081 Allergic rhinitis due to animal (cat) (dog) hair and dander: Secondary | ICD-10-CM | POA: Diagnosis not present

## 2021-02-05 DIAGNOSIS — J302 Other seasonal allergic rhinitis: Secondary | ICD-10-CM | POA: Diagnosis not present

## 2021-02-05 DIAGNOSIS — J3089 Other allergic rhinitis: Secondary | ICD-10-CM | POA: Diagnosis not present

## 2021-02-05 DIAGNOSIS — J301 Allergic rhinitis due to pollen: Secondary | ICD-10-CM | POA: Diagnosis not present

## 2021-02-12 DIAGNOSIS — J302 Other seasonal allergic rhinitis: Secondary | ICD-10-CM | POA: Diagnosis not present

## 2021-02-12 DIAGNOSIS — J3089 Other allergic rhinitis: Secondary | ICD-10-CM | POA: Diagnosis not present

## 2021-02-12 DIAGNOSIS — J3081 Allergic rhinitis due to animal (cat) (dog) hair and dander: Secondary | ICD-10-CM | POA: Diagnosis not present

## 2021-02-12 DIAGNOSIS — J301 Allergic rhinitis due to pollen: Secondary | ICD-10-CM | POA: Diagnosis not present

## 2021-02-20 DIAGNOSIS — J301 Allergic rhinitis due to pollen: Secondary | ICD-10-CM | POA: Diagnosis not present

## 2021-02-20 DIAGNOSIS — J3089 Other allergic rhinitis: Secondary | ICD-10-CM | POA: Diagnosis not present

## 2021-02-20 DIAGNOSIS — J3081 Allergic rhinitis due to animal (cat) (dog) hair and dander: Secondary | ICD-10-CM | POA: Diagnosis not present

## 2021-02-20 DIAGNOSIS — J302 Other seasonal allergic rhinitis: Secondary | ICD-10-CM | POA: Diagnosis not present

## 2021-03-05 DIAGNOSIS — J302 Other seasonal allergic rhinitis: Secondary | ICD-10-CM | POA: Diagnosis not present

## 2021-03-05 DIAGNOSIS — J3081 Allergic rhinitis due to animal (cat) (dog) hair and dander: Secondary | ICD-10-CM | POA: Diagnosis not present

## 2021-03-05 DIAGNOSIS — J301 Allergic rhinitis due to pollen: Secondary | ICD-10-CM | POA: Diagnosis not present

## 2021-03-05 DIAGNOSIS — J3089 Other allergic rhinitis: Secondary | ICD-10-CM | POA: Diagnosis not present

## 2021-03-19 DIAGNOSIS — J4520 Mild intermittent asthma, uncomplicated: Secondary | ICD-10-CM | POA: Diagnosis not present

## 2021-03-19 DIAGNOSIS — J3089 Other allergic rhinitis: Secondary | ICD-10-CM | POA: Diagnosis not present

## 2021-03-19 DIAGNOSIS — J301 Allergic rhinitis due to pollen: Secondary | ICD-10-CM | POA: Diagnosis not present

## 2021-03-19 DIAGNOSIS — J3081 Allergic rhinitis due to animal (cat) (dog) hair and dander: Secondary | ICD-10-CM | POA: Diagnosis not present

## 2021-03-21 DIAGNOSIS — J3089 Other allergic rhinitis: Secondary | ICD-10-CM | POA: Diagnosis not present

## 2021-03-21 DIAGNOSIS — J3081 Allergic rhinitis due to animal (cat) (dog) hair and dander: Secondary | ICD-10-CM | POA: Diagnosis not present

## 2021-03-21 DIAGNOSIS — J301 Allergic rhinitis due to pollen: Secondary | ICD-10-CM | POA: Diagnosis not present

## 2021-04-09 DIAGNOSIS — J302 Other seasonal allergic rhinitis: Secondary | ICD-10-CM | POA: Diagnosis not present

## 2021-04-09 DIAGNOSIS — J3081 Allergic rhinitis due to animal (cat) (dog) hair and dander: Secondary | ICD-10-CM | POA: Diagnosis not present

## 2021-04-09 DIAGNOSIS — J301 Allergic rhinitis due to pollen: Secondary | ICD-10-CM | POA: Diagnosis not present

## 2021-04-09 DIAGNOSIS — J3089 Other allergic rhinitis: Secondary | ICD-10-CM | POA: Diagnosis not present

## 2021-04-12 DIAGNOSIS — J3089 Other allergic rhinitis: Secondary | ICD-10-CM | POA: Diagnosis not present

## 2021-04-12 DIAGNOSIS — J3081 Allergic rhinitis due to animal (cat) (dog) hair and dander: Secondary | ICD-10-CM | POA: Diagnosis not present

## 2021-04-12 DIAGNOSIS — J301 Allergic rhinitis due to pollen: Secondary | ICD-10-CM | POA: Diagnosis not present

## 2021-04-12 DIAGNOSIS — J302 Other seasonal allergic rhinitis: Secondary | ICD-10-CM | POA: Diagnosis not present

## 2021-04-13 DIAGNOSIS — J3089 Other allergic rhinitis: Secondary | ICD-10-CM | POA: Diagnosis not present

## 2021-04-13 DIAGNOSIS — J45901 Unspecified asthma with (acute) exacerbation: Secondary | ICD-10-CM | POA: Diagnosis not present

## 2021-05-22 DIAGNOSIS — J3089 Other allergic rhinitis: Secondary | ICD-10-CM | POA: Diagnosis not present

## 2021-05-22 DIAGNOSIS — J301 Allergic rhinitis due to pollen: Secondary | ICD-10-CM | POA: Diagnosis not present

## 2021-05-22 DIAGNOSIS — J3081 Allergic rhinitis due to animal (cat) (dog) hair and dander: Secondary | ICD-10-CM | POA: Diagnosis not present

## 2021-05-30 DIAGNOSIS — J3081 Allergic rhinitis due to animal (cat) (dog) hair and dander: Secondary | ICD-10-CM | POA: Diagnosis not present

## 2021-05-30 DIAGNOSIS — J301 Allergic rhinitis due to pollen: Secondary | ICD-10-CM | POA: Diagnosis not present

## 2021-05-30 DIAGNOSIS — J3089 Other allergic rhinitis: Secondary | ICD-10-CM | POA: Diagnosis not present

## 2021-06-04 DIAGNOSIS — J301 Allergic rhinitis due to pollen: Secondary | ICD-10-CM | POA: Diagnosis not present

## 2021-06-04 DIAGNOSIS — J3081 Allergic rhinitis due to animal (cat) (dog) hair and dander: Secondary | ICD-10-CM | POA: Diagnosis not present

## 2021-06-04 DIAGNOSIS — J3089 Other allergic rhinitis: Secondary | ICD-10-CM | POA: Diagnosis not present

## 2021-06-05 DIAGNOSIS — H5213 Myopia, bilateral: Secondary | ICD-10-CM | POA: Diagnosis not present

## 2021-06-05 DIAGNOSIS — H53143 Visual discomfort, bilateral: Secondary | ICD-10-CM | POA: Diagnosis not present

## 2021-06-06 DIAGNOSIS — Z713 Dietary counseling and surveillance: Secondary | ICD-10-CM | POA: Diagnosis not present

## 2021-06-06 DIAGNOSIS — Z719 Counseling, unspecified: Secondary | ICD-10-CM | POA: Diagnosis not present

## 2021-06-06 DIAGNOSIS — Z0000 Encounter for general adult medical examination without abnormal findings: Secondary | ICD-10-CM | POA: Diagnosis not present

## 2021-06-06 DIAGNOSIS — Z7189 Other specified counseling: Secondary | ICD-10-CM | POA: Diagnosis not present

## 2021-06-06 LAB — HEMOGLOBIN A1C: Hemoglobin A1C: 5.1

## 2021-06-06 LAB — VITAMIN D 25 HYDROXY (VIT D DEFICIENCY, FRACTURES): Vit D, 25-Hydroxy: 37

## 2021-06-08 DIAGNOSIS — J301 Allergic rhinitis due to pollen: Secondary | ICD-10-CM | POA: Diagnosis not present

## 2021-06-08 DIAGNOSIS — J3081 Allergic rhinitis due to animal (cat) (dog) hair and dander: Secondary | ICD-10-CM | POA: Diagnosis not present

## 2021-06-08 DIAGNOSIS — J3089 Other allergic rhinitis: Secondary | ICD-10-CM | POA: Diagnosis not present

## 2021-06-15 DIAGNOSIS — J301 Allergic rhinitis due to pollen: Secondary | ICD-10-CM | POA: Diagnosis not present

## 2021-06-15 DIAGNOSIS — J3089 Other allergic rhinitis: Secondary | ICD-10-CM | POA: Diagnosis not present

## 2021-06-15 DIAGNOSIS — J3081 Allergic rhinitis due to animal (cat) (dog) hair and dander: Secondary | ICD-10-CM | POA: Diagnosis not present

## 2021-07-12 DIAGNOSIS — Z20828 Contact with and (suspected) exposure to other viral communicable diseases: Secondary | ICD-10-CM | POA: Diagnosis not present

## 2021-08-07 DIAGNOSIS — U071 COVID-19: Secondary | ICD-10-CM | POA: Diagnosis not present

## 2021-08-24 DIAGNOSIS — R5383 Other fatigue: Secondary | ICD-10-CM | POA: Diagnosis not present

## 2021-08-24 LAB — POCT ERYTHROCYTE SEDIMENTATION RATE, NON-AUTOMATED: Sed Rate: 2

## 2021-10-22 DIAGNOSIS — Z23 Encounter for immunization: Secondary | ICD-10-CM | POA: Diagnosis not present

## 2021-10-22 DIAGNOSIS — L309 Dermatitis, unspecified: Secondary | ICD-10-CM | POA: Diagnosis not present

## 2021-10-22 DIAGNOSIS — L989 Disorder of the skin and subcutaneous tissue, unspecified: Secondary | ICD-10-CM | POA: Diagnosis not present

## 2021-11-12 DIAGNOSIS — E079 Disorder of thyroid, unspecified: Secondary | ICD-10-CM | POA: Diagnosis not present

## 2021-11-12 DIAGNOSIS — E042 Nontoxic multinodular goiter: Secondary | ICD-10-CM | POA: Diagnosis not present

## 2021-11-13 DIAGNOSIS — D2260 Melanocytic nevi of unspecified upper limb, including shoulder: Secondary | ICD-10-CM | POA: Diagnosis not present

## 2021-11-13 DIAGNOSIS — Z808 Family history of malignant neoplasm of other organs or systems: Secondary | ICD-10-CM | POA: Diagnosis not present

## 2021-11-13 DIAGNOSIS — D225 Melanocytic nevi of trunk: Secondary | ICD-10-CM | POA: Diagnosis not present

## 2021-11-13 DIAGNOSIS — L739 Follicular disorder, unspecified: Secondary | ICD-10-CM | POA: Diagnosis not present

## 2021-11-28 DIAGNOSIS — N924 Excessive bleeding in the premenopausal period: Secondary | ICD-10-CM | POA: Diagnosis not present

## 2021-11-28 DIAGNOSIS — R634 Abnormal weight loss: Secondary | ICD-10-CM | POA: Diagnosis not present

## 2021-11-28 LAB — BASIC METABOLIC PANEL
BUN: 15 (ref 4–21)
Chloride: 101 (ref 99–108)
Creatinine: 0.6 (ref 0.5–1.1)
Glucose: 100
Potassium: 4.1 mEq/L (ref 3.5–5.1)
Sodium: 137 (ref 137–147)

## 2021-11-28 LAB — COMPREHENSIVE METABOLIC PANEL
Albumin: 5 (ref 3.5–5.0)
Calcium: 9.7 (ref 8.7–10.7)
Globulin: 2.4

## 2021-11-28 LAB — TSH: TSH: 0.89 (ref 0.41–5.90)

## 2021-11-28 LAB — HEPATIC FUNCTION PANEL
ALT: 15 U/L (ref 7–35)
AST: 15 (ref 13–35)
Alkaline Phosphatase: 45 (ref 25–125)
Bilirubin, Total: 0.4

## 2021-11-28 LAB — LIPID PANEL
Cholesterol: 132 (ref 0–200)
HDL: 64 (ref 35–70)
LDL Cholesterol: 57
LDl/HDL Ratio: 2.1
Triglycerides: 49 (ref 40–160)

## 2022-01-11 DIAGNOSIS — L2084 Intrinsic (allergic) eczema: Secondary | ICD-10-CM | POA: Diagnosis not present

## 2022-02-06 DIAGNOSIS — H1045 Other chronic allergic conjunctivitis: Secondary | ICD-10-CM | POA: Diagnosis not present

## 2022-02-18 DIAGNOSIS — H1045 Other chronic allergic conjunctivitis: Secondary | ICD-10-CM | POA: Diagnosis not present

## 2022-03-13 DIAGNOSIS — H1045 Other chronic allergic conjunctivitis: Secondary | ICD-10-CM | POA: Diagnosis not present

## 2022-03-28 DIAGNOSIS — H1045 Other chronic allergic conjunctivitis: Secondary | ICD-10-CM | POA: Diagnosis not present

## 2022-04-04 ENCOUNTER — Other Ambulatory Visit: Payer: Self-pay

## 2022-05-14 ENCOUNTER — Ambulatory Visit: Payer: Medicaid Other | Admitting: Family Medicine

## 2022-06-10 ENCOUNTER — Encounter: Payer: Self-pay | Admitting: Family Medicine

## 2022-06-10 ENCOUNTER — Ambulatory Visit (INDEPENDENT_AMBULATORY_CARE_PROVIDER_SITE_OTHER): Payer: BC Managed Care – PPO | Admitting: Family Medicine

## 2022-06-10 VITALS — BP 94/58 | HR 84 | Temp 98.1°F | Ht 64.5 in | Wt 112.0 lb

## 2022-06-10 DIAGNOSIS — L2082 Flexural eczema: Secondary | ICD-10-CM

## 2022-06-10 DIAGNOSIS — Z681 Body mass index (BMI) 19 or less, adult: Secondary | ICD-10-CM | POA: Diagnosis not present

## 2022-06-10 DIAGNOSIS — L309 Dermatitis, unspecified: Secondary | ICD-10-CM | POA: Insufficient documentation

## 2022-06-10 DIAGNOSIS — R0600 Dyspnea, unspecified: Secondary | ICD-10-CM

## 2022-06-10 DIAGNOSIS — Z7689 Persons encountering health services in other specified circumstances: Secondary | ICD-10-CM

## 2022-06-10 MED ORDER — TRIAMCINOLONE ACETONIDE 0.1 % EX CREA: TOPICAL_CREAM | Freq: Two times a day (BID) | CUTANEOUS | 1 refills | Status: AC

## 2022-06-10 MED ORDER — DESONIDE 0.05 % EX CREA: TOPICAL_CREAM | Freq: Two times a day (BID) | CUTANEOUS | 2 refills | Status: AC | PRN

## 2022-06-10 NOTE — Patient Instructions (Addendum)
Schedule your physical on one of your breaks.         Great to see you today.   If labs were collected, we will inform you of lab results once received either by echart message or telephone call.   - echart message- for normal results that have been seen by the patient already.   - telephone call: abnormal results or if patient has not viewed results in their echart.

## 2022-06-10 NOTE — Progress Notes (Signed)
Patient ID: Julie Green, female  DOB: 27-Sep-2001, 21 y.o.   MRN: [id number] Patient Care Team    Relationship Specialty Notifications Start End  Natalia Leatherwood, DO PCP - General Family Medicine  06/10/22     Chief Complaint  Patient presents with   Establish Care   Rash    Pt c/o rash on arms and legs x 1 days; pmhx of eczema     Subjective:  Julie Green is a 21 y.o.  female present for new patient establishment. All past medical history, surgical history, allergies, family history, immunizations, medications and social history were updated in the electronic medical record today. All recent labs, ED visits and hospitalizations within the last year were reviewed.  Eczema: Patient reports she is struggled with eczema for many years.  She finds the triamcinolone and the desonide worked the best when she has outbreaks.  She has been seen by dermatology and allergist for this condition.  She states her eczema had been completely controlled all summer long while she was in [place], and since she came back to [place] she noticed eczema starting in the right antecubital fossa.     06/10/2022    1:22 PM  Depression screen PHQ 2/9  Decreased Interest 0  Down, Depressed, Hopeless 0  PHQ - 2 Score 0       No data to display                 No data to display          Immunization History  Administered Date(s) Administered   DTaP 12/24/2000, 03/06/2001, 05/06/2001, 05/17/2002, 07/01/2005   HPV 9-valent 11/25/2011, 12/26/2011, 06/16/2012   Hepatitis A 02/07/2007, 10/12/2007   Hepatitis B November 29, 1999, 12/24/2000, 05/06/2001   HiB (PRP-OMP) 07/01/2005   IPV 12/24/2000, 03/06/2001, 05/06/2001, 05/17/2002   MenQuadfi_Meningococcal Groups ACYW Conjugate 02/28/2016, 12/05/2017   Meningococcal Polysaccharide 09/24/2017   PFIZER(Purple Top)SARS-COV-2 Vaccination 03/23/2020, 04/13/2020   Pneumococcal Conjugate-13 07/01/2005   Tdap 07/23/2013   Varicella  06/28/2005, 10/12/2007    No results found.  Past Medical History:  Diagnosis Date   Allergies    Allergy    Asthma    Eczema    History of BCG vaccination    Allergies  Allergen Reactions   Grass Pollen(K-O-R-T-Swt Vern) Cough   Other Itching   Past Surgical History:  Procedure Laterality Date   WISDOM TOOTH EXTRACTION  2019   Family History  Problem Relation Age of Onset   Skin cancer Father    Diabetes Maternal Grandmother    Breast cancer Maternal Grandmother    Hypertension Maternal Grandfather    Hearing loss Maternal Grandfather    Melanoma Maternal Grandfather    Hypertension Paternal Grandmother    Diabetes Paternal Grandmother    Stroke Paternal Grandfather    Heart attack Paternal Grandfather    Prostate cancer Paternal Grandfather    Social History   Social History Narrative   Marital status/children/pets: Single.   Education/employment: Building control surveyor.   Safety:      -smoke alarm in the home:Yes     - wears seatbelt: Yes     - Feels safe in their relationships: Yes       Allergies as of 06/10/2022       Reactions   Grass Pollen(k-o-r-t-swt Vern) Cough   Other Itching        Medication List        Accurate as of June 10, 2022  4:36 PM. If you have any questions, ask your nurse or doctor.          ALBUTEROL SULFATE HFA IN Inhale into the lungs.   cetirizine 10 MG tablet Commonly known as: ZYRTEC Take by mouth.   desonide 0.05 % cream Commonly known as: DESOWEN Apply topically 2 (two) times daily as needed. What changed: See the new instructions. Changed by: Felix Pacini, DO   triamcinolone cream 0.1 % Commonly known as: KENALOG Apply topically 2 (two) times daily. What changed: when to take this Changed by: Felix Pacini, DO        All past medical history, surgical history, allergies, family history, immunizations andmedications were updated in the EMR today and reviewed under the history and medication portions  of their EMR.    No results found for this or any previous visit (from the past 2160 hour(s)).  US THYROID Result Date: 02/08/2019 IMPRESSION: Decrease in size of the solid and cystic nodule in the right lobe after prior fine-needle aspiration. The nodule does not meet criteria for biopsy or further follow-up.    ROS 14 pt review of systems performed and negative (unless mentioned in an HPI)  Objective: BP (!) 94/58   Pulse 84   Temp 98.1 F (36.7 C) (Oral)   Ht 5' 4.5" (1.638 m)   Wt 112 lb (50.8 kg)   LMP 05/16/2022   SpO2 100%   BMI 18.93 kg/m  Physical Exam Vitals and nursing note reviewed.  Constitutional:      General: She is not in acute distress.    Appearance: Normal appearance. She is normal weight. She is not ill-appearing or toxic-appearing.  Eyes:     Extraocular Movements: Extraocular movements intact.     Conjunctiva/sclera: Conjunctivae normal.     Pupils: Pupils are equal, round, and reactive to light.  Cardiovascular:     Rate and Rhythm: Normal rate and regular rhythm.  Pulmonary:     Effort: Pulmonary effort is normal.     Breath sounds: Normal breath sounds.  Skin:    Findings: Rash present.     Comments: Dry scaly rash on erythema base right antecubital fossa  Neurological:     Mental Status: She is alert and oriented to person, place, and time. Mental status is at baseline.  Psychiatric:        Mood and Affect: Mood normal.        Behavior: Behavior normal.        Thought Content: Thought content normal.        Judgment: Judgment normal.       Assessment/plan: Julie Green is a 21 y.o. female present for est care Flexural eczema Refilled triamcinolone and desonide for her. We discussed all the potential causes of flare of her eczema.  Possibly related to stress of the moving back and starting school again. Encouraged her to continue Zyrtec nightly   Postnasal drip: Start OTC Flonase twice daily to help with allergies.  Body mass  index (BMI) less than 19 We discussed healthy body weight for her.  She reported she had a hard time keeping on weight.  This summer she was able to build some muscle when going outside.  We discussed she is currently at a healthy weight, would not advise additional weight loss below 110.  We discussed eating 3 meals daily, even if needing to eat small meals and snacking between with healthy options to maintain weight.  She will follow-up either spring break or Christmas  break for her physical  No orders of the defined types were placed in this encounter.  Meds ordered this encounter  Medications   desonide (DESOWEN) 0.05 % cream    Sig: Apply topically 2 (two) times daily as needed.    Dispense:  60 g    Refill:  2   triamcinolone cream (KENALOG) 0.1 %    Sig: Apply topically 2 (two) times daily.    Dispense:  45 g    Refill:  1    Hold until pt request   Referral Orders  No referral(s) requested today     Note is dictated utilizing voice recognition software. Although note has been proof read prior to signing, occasional typographical errors still can be missed. If any questions arise, please do not hesitate to call for verification.  Electronically signed by: Howard Pouch, DO [place]

## 2022-06-13 ENCOUNTER — Encounter: Payer: Self-pay | Admitting: Family Medicine

## 2022-06-13 DIAGNOSIS — J45990 Exercise induced bronchospasm: Secondary | ICD-10-CM | POA: Insufficient documentation

## 2022-06-13 DIAGNOSIS — T7840XA Allergy, unspecified, initial encounter: Secondary | ICD-10-CM | POA: Insufficient documentation

## 2022-08-12 DIAGNOSIS — L309 Dermatitis, unspecified: Secondary | ICD-10-CM | POA: Diagnosis not present

## 2022-08-19 DIAGNOSIS — L209 Atopic dermatitis, unspecified: Secondary | ICD-10-CM | POA: Diagnosis not present

## 2022-08-19 DIAGNOSIS — L309 Dermatitis, unspecified: Secondary | ICD-10-CM | POA: Diagnosis not present

## 2022-08-19 DIAGNOSIS — Z5181 Encounter for therapeutic drug level monitoring: Secondary | ICD-10-CM | POA: Diagnosis not present

## 2022-09-25 DIAGNOSIS — H52221 Regular astigmatism, right eye: Secondary | ICD-10-CM | POA: Diagnosis not present

## 2022-09-25 DIAGNOSIS — L209 Atopic dermatitis, unspecified: Secondary | ICD-10-CM | POA: Diagnosis not present

## 2022-10-22 ENCOUNTER — Ambulatory Visit (INDEPENDENT_AMBULATORY_CARE_PROVIDER_SITE_OTHER): Payer: BC Managed Care – PPO | Admitting: Family Medicine

## 2022-10-22 ENCOUNTER — Other Ambulatory Visit: Payer: Self-pay | Admitting: Family Medicine

## 2022-10-22 ENCOUNTER — Encounter: Payer: Self-pay | Admitting: Family Medicine

## 2022-10-22 VITALS — BP 123/67 | HR 95 | Temp 98.5°F | Ht 64.0 in | Wt 119.0 lb

## 2022-10-22 DIAGNOSIS — J45990 Exercise induced bronchospasm: Secondary | ICD-10-CM

## 2022-10-22 DIAGNOSIS — Z0000 Encounter for general adult medical examination without abnormal findings: Secondary | ICD-10-CM | POA: Diagnosis not present

## 2022-10-22 MED ORDER — ALBUTEROL SULFATE HFA 108 (90 BASE) MCG/ACT IN AERS: 2.0000 | INHALATION_SPRAY | Freq: Four times a day (QID) | RESPIRATORY_TRACT | 11 refills | Status: DC | PRN

## 2022-10-22 NOTE — Progress Notes (Signed)
Patient ID: Julie Green, female  DOB: [birth year], 21 y.o.   MRN: [id number] Patient Care Team    Relationship Specialty Notifications Start End  Natalia Leatherwood, DO PCP - General Family Medicine  06/10/22   [facility] Consulting Physician Dermatology  06/03/22    Comment: [facility] location  [facility] ENT Consulting Physician   06/03/22    Comment: Dr. Chucky May, Helen Hashimoto, OD  Optometry  06/13/22   Axner, Lowanda Foster, PA-C  Dermatology  06/13/22    Comment: Derm specialist  Sidney Ace, MD Referring Physician Allergy  06/13/22     Chief Complaint  Patient presents with   Annual Exam    Pt is not fasting    Subjective:  Julie Green is a 21 y.o.  Female  present for CPE  All past medical history, surgical history, allergies, family history, immunizations, medications and social history were updated in the electronic medical record today. All recent labs, ED visits and hospitalizations within the last year were reviewed.  Health maintenance:  Colonoscopy: No family history.  Routine screening 44 Mammogram: Family history of breast cancer in grandmother.  Routine screening at 39 Cervical cancer screening: no prior pap. Never SA. Immunizations: tdap 07/13/2013 UTD> will schedule NV to have completed, Influenza declined (encouraged yearly), HPV series completed Infectious disease screening: HIV, Hep C, urine cytology - never sexually active DEXA: Routine screening Patient has a Dental home. Hospitalizations/ED visits: Reviewed      10/22/2022   10:36 AM 06/10/2022    1:22 PM  Depression screen PHQ 2/9  Decreased Interest 0 0  Down, Depressed, Hopeless 0 0  PHQ - 2 Score 0 0       No data to display          Immunization History  Administered Date(s) Administered   DTaP 12/24/2000, 03/06/2001, 05/06/2001, 05/17/2002, 07/01/2005   HIB (PRP-OMP) 07/01/2005   HPV 9-valent 11/25/2011, 12/26/2011, 06/16/2012   Hepatitis A 02/07/2007, 10/12/2007    Hepatitis B May 15, 2000, 12/24/2000, 05/06/2001   IPV 12/24/2000, 03/06/2001, 05/06/2001, 05/17/2002   MenQuadfi_Meningococcal Groups ACYW Conjugate 02/28/2016, 12/05/2017   Meningococcal Polysaccharide 09/24/2017   PFIZER(Purple Top)SARS-COV-2 Vaccination 03/23/2020, 04/13/2020   Pneumococcal Conjugate-13 07/01/2005   Tdap 07/23/2013   Varicella 06/28/2005, 10/12/2007     Past Medical History:  Diagnosis Date   Allergies    Allergy    Asthma    Eczema    History of BCG vaccination    Allergies  Allergen Reactions   Grass Pollen(K-O-R-T-Swt Vern) Cough   Other Itching    Grass, tree, weed pollens, environmental mold, dust mites, cats, dogs and feathers   Past Surgical History:  Procedure Laterality Date   WISDOM TOOTH EXTRACTION  2019   Family History  Problem Relation Age of Onset   Skin cancer Father    Diabetes Maternal Grandmother    Breast cancer Maternal Grandmother 47   Hypertension Maternal Grandfather    Hearing loss Maternal Grandfather    Melanoma Maternal Grandfather    Hypertension Paternal Grandmother    Diabetes Paternal Grandmother    Stroke Paternal Grandfather    Heart attack Paternal Grandfather    Prostate cancer Paternal Grandfather    Social History   Social History Narrative   Marital status/children/pets: Single.   Education/employment: Building control surveyor.   Safety:      -smoke alarm in the home:Yes     - wears seatbelt: Yes     - Feels safe in their relationships:  Yes       Allergies as of 10/22/2022       Reactions   Grass Pollen(k-o-r-t-swt Vern) Cough   Other Itching   Grass, tree, weed pollens, environmental mold, dust mites, cats, dogs and feathers        Medication List        Accurate as of October 22, 2022 11:11 AM. If you have any questions, ask your nurse or doctor.          STOP taking these medications    cetirizine 10 MG tablet Commonly known as: ZYRTEC Stopped by: Julie Pacini, DO       TAKE  these medications    albuterol 108 (90 Base) MCG/ACT inhaler Commonly known as: VENTOLIN HFA Inhale 2 puffs into the lungs every 6 (six) hours as needed for wheezing or shortness of breath. What changed:  medication strength how much to take when to take this reasons to take this Changed by: Julie Pacini, DO   chlorpheniramine 4 MG tablet Commonly known as: CHLOR-TRIMETON Take 4 mg by mouth 2 (two) times daily as needed for allergies.   desonide 0.05 % cream Commonly known as: DESOWEN Apply topically 2 (two) times daily as needed.   Dupixent 300 MG/2ML Sopn Generic drug: Dupilumab Inject into the skin.   fexofenadine 180 MG tablet Commonly known as: ALLEGRA Take 180 mg by mouth daily.   triamcinolone cream 0.1 % Commonly known as: KENALOG Apply topically 2 (two) times daily.        All past medical history, surgical history, allergies, family history, immunizations andmedications were updated in the EMR today and reviewed under the history and medication portions of their EMR.     No results found for this or any previous visit (from the past 2160 hour(s)).  US THYROID  Result Date: 02/08/2019  IMPRESSION: Decrease in size of the solid and cystic nodule in the right lobe after prior fine-needle aspiration. The nodule does not meet criteria for biopsy or further follow-up.     ROS 14 pt review of systems performed and negative (unless mentioned in an HPI)  Objective: BP 123/67   Pulse 95   Temp 98.5 F (36.9 C) (Oral)   Ht 5\' 4"  (1.626 m)   Wt 119 lb (54 kg)   SpO2 99%   BMI 20.43 kg/m  Physical Exam Vitals and nursing note reviewed.  Constitutional:      General: She is not in acute distress.    Appearance: Normal appearance. She is not ill-appearing or toxic-appearing.  HENT:     Head: Normocephalic and atraumatic.     Right Ear: Tympanic membrane, ear canal and external ear normal. There is no impacted cerumen.     Left Ear: Tympanic membrane, ear  canal and external ear normal. There is no impacted cerumen.     Nose: No congestion or rhinorrhea.     Mouth/Throat:     Mouth: Mucous membranes are moist.     Pharynx: Oropharynx is clear. No oropharyngeal exudate or posterior oropharyngeal erythema.  Eyes:     General: No scleral icterus.       Right eye: No discharge.        Left eye: No discharge.     Extraocular Movements: Extraocular movements intact.     Conjunctiva/sclera: Conjunctivae normal.     Pupils: Pupils are equal, round, and reactive to light.  Cardiovascular:     Rate and Rhythm: Normal rate and regular rhythm.  Pulses: Normal pulses.     Heart sounds: Normal heart sounds. No murmur heard.    No friction rub. No gallop.  Pulmonary:     Effort: Pulmonary effort is normal. No respiratory distress.     Breath sounds: Normal breath sounds. No stridor. No wheezing, rhonchi or rales.  Chest:     Chest wall: No tenderness.  Abdominal:     General: Abdomen is flat. Bowel sounds are normal. There is no distension.     Palpations: Abdomen is soft. There is no mass.     Tenderness: There is no abdominal tenderness. There is no right CVA tenderness, left CVA tenderness, guarding or rebound.     Hernia: No hernia is present.  Musculoskeletal:        General: No swelling, tenderness or deformity. Normal range of motion.     Cervical back: Normal range of motion and neck supple. No rigidity or tenderness.     Right lower leg: No edema.     Left lower leg: No edema.  Lymphadenopathy:     Cervical: No cervical adenopathy.  Skin:    General: Skin is warm and dry.     Coloration: Skin is not jaundiced or pale.     Findings: No bruising, erythema, lesion or rash.  Neurological:     General: No focal deficit present.     Mental Status: She is alert and oriented to person, place, and time. Mental status is at baseline.     Cranial Nerves: No cranial nerve deficit.     Sensory: No sensory deficit.     Motor: No weakness.      Coordination: Coordination normal.     Gait: Gait normal.     Deep Tendon Reflexes: Reflexes normal.  Psychiatric:        Mood and Affect: Mood normal.        Behavior: Behavior normal.        Thought Content: Thought content normal.        Judgment: Judgment normal.      No results found.  Assessment/plan: Julie Green is a 21 y.o. female present for CPE  Exercise-induced asthma Refill albuterol for her today Routine general medical examination at a health care facility Patient was encouraged to exercise greater than 150 minutes a week. Patient was encouraged to choose a diet filled with fresh fruits and vegetables, and lean meats. AVS provided to patient today for education/recommendation on gender specific health and safety maintenance. Colonoscopy: No family history.  Routine screening 44 Mammogram: Family history of breast cancer in grandmother.  Routine screening at 39 Cervical cancer screening: no prior pap. Never SA. Immunizations: tdap 07/13/2013 UTD> will schedule NV to have completed, Influenza declined (encouraged yearly), HPV series completed Infectious disease screening: HIV, Hep C, urine cytology - never sexually active DEXA: Routine screening  Return in about 1 year (around 10/24/2023) for cpe (20 min).  No orders of the defined types were placed in this encounter.   No orders of the defined types were placed in this encounter.  Meds ordered this encounter  Medications   albuterol (VENTOLIN HFA) 108 (90 Base) MCG/ACT inhaler    Sig: Inhale 2 puffs into the lungs every 6 (six) hours as needed for wheezing or shortness of breath.    Dispense:  1 each    Refill:  11   Referral Orders  No referral(s) requested today     Electronically signed by: Julie Pacini, DO [facility]

## 2022-10-22 NOTE — Patient Instructions (Addendum)

## 2023-02-27 DIAGNOSIS — N760 Acute vaginitis: Secondary | ICD-10-CM | POA: Diagnosis not present

## 2023-04-02 DIAGNOSIS — H10213 Acute toxic conjunctivitis, bilateral: Secondary | ICD-10-CM | POA: Diagnosis not present

## 2023-04-02 DIAGNOSIS — H16223 Keratoconjunctivitis sicca, not specified as Sjogren's, bilateral: Secondary | ICD-10-CM | POA: Diagnosis not present

## 2023-04-02 DIAGNOSIS — H1045 Other chronic allergic conjunctivitis: Secondary | ICD-10-CM | POA: Diagnosis not present

## 2023-04-02 DIAGNOSIS — H02845 Edema of left lower eyelid: Secondary | ICD-10-CM | POA: Diagnosis not present

## 2023-04-02 DIAGNOSIS — H02842 Edema of right lower eyelid: Secondary | ICD-10-CM | POA: Diagnosis not present

## 2023-04-03 DIAGNOSIS — Z5181 Encounter for therapeutic drug level monitoring: Secondary | ICD-10-CM | POA: Diagnosis not present

## 2023-04-03 DIAGNOSIS — L209 Atopic dermatitis, unspecified: Secondary | ICD-10-CM | POA: Diagnosis not present

## 2023-04-21 DIAGNOSIS — H16223 Keratoconjunctivitis sicca, not specified as Sjogren's, bilateral: Secondary | ICD-10-CM | POA: Diagnosis not present

## 2023-04-21 DIAGNOSIS — H1045 Other chronic allergic conjunctivitis: Secondary | ICD-10-CM | POA: Diagnosis not present

## 2023-04-21 DIAGNOSIS — H10213 Acute toxic conjunctivitis, bilateral: Secondary | ICD-10-CM | POA: Diagnosis not present

## 2023-07-14 DIAGNOSIS — H1045 Other chronic allergic conjunctivitis: Secondary | ICD-10-CM | POA: Diagnosis not present

## 2023-07-14 DIAGNOSIS — H16223 Keratoconjunctivitis sicca, not specified as Sjogren's, bilateral: Secondary | ICD-10-CM | POA: Diagnosis not present

## 2023-07-14 DIAGNOSIS — H10213 Acute toxic conjunctivitis, bilateral: Secondary | ICD-10-CM | POA: Diagnosis not present

## 2023-10-23 DIAGNOSIS — H10213 Acute toxic conjunctivitis, bilateral: Secondary | ICD-10-CM | POA: Diagnosis not present

## 2023-10-23 DIAGNOSIS — H52221 Regular astigmatism, right eye: Secondary | ICD-10-CM | POA: Diagnosis not present

## 2023-10-23 DIAGNOSIS — H1045 Other chronic allergic conjunctivitis: Secondary | ICD-10-CM | POA: Diagnosis not present

## 2023-10-23 DIAGNOSIS — H16223 Keratoconjunctivitis sicca, not specified as Sjogren's, bilateral: Secondary | ICD-10-CM | POA: Diagnosis not present

## 2023-10-24 ENCOUNTER — Ambulatory Visit: Payer: BC Managed Care – PPO | Admitting: Family Medicine

## 2023-10-24 ENCOUNTER — Encounter: Payer: Self-pay | Admitting: Family Medicine

## 2023-10-24 VITALS — BP 118/72 | HR 88 | Temp 97.6°F | Ht 65.5 in | Wt 113.0 lb

## 2023-10-24 DIAGNOSIS — J45990 Exercise induced bronchospasm: Secondary | ICD-10-CM | POA: Diagnosis not present

## 2023-10-24 DIAGNOSIS — Z1159 Encounter for screening for other viral diseases: Secondary | ICD-10-CM | POA: Diagnosis not present

## 2023-10-24 DIAGNOSIS — L209 Atopic dermatitis, unspecified: Secondary | ICD-10-CM | POA: Diagnosis not present

## 2023-10-24 DIAGNOSIS — Z118 Encounter for screening for other infectious and parasitic diseases: Secondary | ICD-10-CM

## 2023-10-24 DIAGNOSIS — Z0001 Encounter for general adult medical examination with abnormal findings: Secondary | ICD-10-CM | POA: Diagnosis not present

## 2023-10-24 DIAGNOSIS — Z0000 Encounter for general adult medical examination without abnormal findings: Secondary | ICD-10-CM

## 2023-10-24 DIAGNOSIS — Z5181 Encounter for therapeutic drug level monitoring: Secondary | ICD-10-CM | POA: Diagnosis not present

## 2023-10-24 DIAGNOSIS — Z23 Encounter for immunization: Secondary | ICD-10-CM

## 2023-10-24 DIAGNOSIS — R209 Unspecified disturbances of skin sensation: Secondary | ICD-10-CM | POA: Diagnosis not present

## 2023-10-24 DIAGNOSIS — Z114 Encounter for screening for human immunodeficiency virus [HIV]: Secondary | ICD-10-CM

## 2023-10-24 DIAGNOSIS — D2261 Melanocytic nevi of right upper limb, including shoulder: Secondary | ICD-10-CM | POA: Diagnosis not present

## 2023-10-24 LAB — COMPREHENSIVE METABOLIC PANEL
ALT: 15 U/L (ref 0–35)
AST: 17 U/L (ref 0–37)
Albumin: 4.6 g/dL (ref 3.5–5.2)
Alkaline Phosphatase: 44 U/L (ref 39–117)
BUN: 16 mg/dL (ref 6–23)
CO2: 30 meq/L (ref 19–32)
Calcium: 9.5 mg/dL (ref 8.4–10.5)
Chloride: 103 meq/L (ref 96–112)
Creatinine, Ser: 0.63 mg/dL (ref 0.40–1.20)
GFR: 125.32 mL/min (ref >60.00)
Glucose, Bld: 84 mg/dL (ref 70–99)
Potassium: 5 meq/L (ref 3.5–5.1)
Sodium: 139 meq/L (ref 135–145)
Total Bilirubin: 1.1 mg/dL (ref 0.2–1.2)
Total Protein: 7 g/dL (ref 6.0–8.3)

## 2023-10-24 LAB — CBC
HCT: 41.6 % (ref 36.0–46.0)
Hemoglobin: 14.1 g/dL (ref 12.0–15.0)
MCHC: 33.9 g/dL (ref 30.0–36.0)
MCV: 91.2 fL (ref 78.0–100.0)
Platelets: 316 10*3/uL (ref 150.0–400.0)
RBC: 4.57 Mil/uL (ref 3.87–5.11)
RDW: 12.7 % (ref 11.5–15.5)
WBC: 4.1 10*3/uL (ref 4.0–10.5)

## 2023-10-24 LAB — TSH: TSH: 2.17 u[IU]/mL (ref 0.35–5.50)

## 2023-10-24 LAB — IBC + FERRITIN
Ferritin: 27.9 ng/mL (ref 10.0–291.0)
Iron: 91 ug/dL (ref 42–145)
Saturation Ratios: 22.6 % (ref 20.0–50.0)
TIBC: 401.8 ug/dL (ref 250.0–450.0)
Transferrin: 287 mg/dL (ref 212.0–360.0)

## 2023-10-24 MED ORDER — ALBUTEROL SULFATE HFA 108 (90 BASE) MCG/ACT IN AERS: 2.0000 | INHALATION_SPRAY | Freq: Four times a day (QID) | RESPIRATORY_TRACT | 11 refills | Status: AC | PRN

## 2023-10-24 NOTE — Progress Notes (Signed)
Patient ID: Julie Green, female  DOB: 2001/06/28, 22 y.o.   MRN: [id number] Patient Care Team    Relationship Specialty Notifications Start End  Natalia Leatherwood, DO PCP - General Family Medicine  06/10/22   [facility] ENT Consulting Physician   06/03/22    Comment: Dr. Veatrice Kells, OD  Optometry  06/13/22   Axner, Lowanda Foster, PA-C  Dermatology  06/13/22    Comment: Derm specialist  Sidney Ace, MD Referring Physician Allergy  06/13/22     Chief Complaint  Patient presents with   Annual Exam    Pt is fasting.  Has never had pap- Does not have GYN Declined flu Had tdap a few weeks agp    Subjective:  Julie Green is a 22 y.o.  Female  present for CPE  All past medical history, surgical history, allergies, family history, immunizations, medications and social history were updated in the electronic medical record today. All recent labs, ED visits and hospitalizations within the last year were reviewed.  Health maintenance:  Colonoscopy: No family history.  Routine screening 44 Mammogram: Family history of breast cancer in grandmother.  Routine screening at 39 Cervical cancer screening: no prior pap. Never SA. Immunizations: tdap 10/2023 UTD, influenza-declined(encouraged yearly) Infectious disease screening: HIV, Hep C, urine cytology - never sexually active DEXA: Routine screening Patient has a Dental home. Hospitalizations/ED visits: Reviewed  Pt reports she has noticed worsening coldness of her fingertips and toes. She reports sometimes she feels her toes are "numb". She denies vegetarian diet or heavy menses. She reports she does not eat red meat often, because of expense, but does eat chicken.       10/22/2022   10:36 AM 06/10/2022    1:22 PM  Depression screen PHQ 2/9  Decreased Interest 0 0  Down, Depressed, Hopeless 0 0  PHQ - 2 Score 0 0       No data to display          Immunization History  Administered Date(s) Administered    DTaP 12/24/2000, 03/06/2001, 05/06/2001, 05/17/2002, 07/01/2005   HIB (PRP-OMP) 07/01/2005   HPV 9-valent 11/25/2011, 12/26/2011, 06/16/2012   Hepatitis A 02/07/2007, 10/12/2007   Hepatitis B June 18, 2000, 12/24/2000, 05/06/2001   IPV 12/24/2000, 03/06/2001, 05/06/2001, 05/17/2002   MenQuadfi_Meningococcal Groups ACYW Conjugate 02/28/2016, 12/05/2017   Meningococcal polysaccharide vaccine (MPSV4) 09/24/2017   PFIZER(Purple Top)SARS-COV-2 Vaccination 03/23/2020, 04/13/2020   Pneumococcal Conjugate-13 07/01/2005   Tdap 07/23/2013, 09/08/2023   Varicella 06/28/2005, 10/12/2007     Past Medical History:  Diagnosis Date   Allergies    Allergy    Asthma    Eczema    History of BCG vaccination    Allergies  Allergen Reactions   Grass Pollen(K-O-R-T-Swt Vern) Cough   Other Itching    Grass, tree, weed pollens, environmental mold, dust mites, cats, dogs and feathers   Past Surgical History:  Procedure Laterality Date   WISDOM TOOTH EXTRACTION  2019   Family History  Problem Relation Age of Onset   Skin cancer Father    Diabetes Maternal Grandmother    Breast cancer Maternal Grandmother 47   Hypertension Maternal Grandfather    Hearing loss Maternal Grandfather    Melanoma Maternal Grandfather    Hypertension Paternal Grandmother    Diabetes Paternal Grandmother    Stroke Paternal Grandfather    Heart attack Paternal Grandfather    Prostate cancer Paternal Grandfather    Social History   Social History Narrative  Marital status/children/pets: Single.   Education/employment: Building control surveyor.   Safety:      -smoke alarm in the home:Yes     - wears seatbelt: Yes     - Feels safe in their relationships: Yes       Allergies as of 10/24/2023       Reactions   Grass Pollen(k-o-r-t-swt Vern) Cough   Other Itching   Grass, tree, weed pollens, environmental mold, dust mites, cats, dogs and feathers        Medication List        Accurate as of October 24, 2023   9:23 AM. If you have any questions, ask your nurse or doctor.          STOP taking these medications    fexofenadine 180 MG tablet Commonly known as: ALLEGRA Stopped by: Felix Pacini       TAKE these medications    albuterol 108 (90 Base) MCG/ACT inhaler Commonly known as: VENTOLIN HFA Inhale 2 puffs into the lungs every 6 (six) hours as needed for wheezing or shortness of breath.   chlorpheniramine 4 MG tablet Commonly known as: CHLOR-TRIMETON Take 4 mg by mouth 2 (two) times daily as needed for allergies.   desonide 0.05 % cream Commonly known as: DESOWEN Apply topically 2 (two) times daily as needed.   doxycycline 100 MG tablet Commonly known as: ADOXA Take 100 mg by mouth 2 (two) times daily.   Dupixent 300 MG/2ML Soaj Generic drug: Dupilumab Inject into the skin.   triamcinolone cream 0.1 % Commonly known as: KENALOG Apply topically 2 (two) times daily.        All past medical history, surgical history, allergies, family history, immunizations andmedications were updated in the EMR today and reviewed under the history and medication portions of their EMR.     No results found for this or any previous visit (from the past 2160 hours).  US THYROID  Result Date: 02/08/2019  IMPRESSION: Decrease in size of the solid and cystic nodule in the right lobe after prior fine-needle aspiration. The nodule does not meet criteria for biopsy or further follow-up.     ROS 14 pt review of systems performed and negative (unless mentioned in an HPI)  Objective: BP 118/72   Pulse 88   Temp 97.6 F (36.4 C)   Ht 5' 5.5" (1.664 m)   Wt 113 lb (51.3 kg)   LMP 10/14/2023   SpO2 98%   BMI 18.52 kg/m  Physical Exam Vitals and nursing note reviewed.  Constitutional:      General: She is not in acute distress.    Appearance: Normal appearance. She is not ill-appearing or toxic-appearing.  HENT:     Head: Normocephalic and atraumatic.     Right Ear: Tympanic  membrane, ear canal and external ear normal. There is no impacted cerumen.     Left Ear: Tympanic membrane, ear canal and external ear normal. There is no impacted cerumen.     Nose: No congestion or rhinorrhea.     Mouth/Throat:     Mouth: Mucous membranes are moist.     Pharynx: Oropharynx is clear. No oropharyngeal exudate or posterior oropharyngeal erythema.  Eyes:     General: No scleral icterus.       Right eye: No discharge.        Left eye: No discharge.     Extraocular Movements: Extraocular movements intact.     Conjunctiva/sclera: Conjunctivae normal.     Pupils: Pupils are  equal, round, and reactive to light.  Cardiovascular:     Rate and Rhythm: Normal rate and regular rhythm.     Pulses: Normal pulses.     Heart sounds: Normal heart sounds. No murmur heard.    No friction rub. No gallop.  Pulmonary:     Effort: Pulmonary effort is normal. No respiratory distress.     Breath sounds: Normal breath sounds. No stridor. No wheezing, rhonchi or rales.  Chest:     Chest wall: No tenderness.  Abdominal:     General: Abdomen is flat. Bowel sounds are normal. There is no distension.     Palpations: Abdomen is soft. There is no mass.     Tenderness: There is no abdominal tenderness. There is no right CVA tenderness, left CVA tenderness, guarding or rebound.     Hernia: No hernia is present.  Musculoskeletal:        General: No swelling, tenderness or deformity. Normal range of motion.     Cervical back: Normal range of motion and neck supple. No rigidity or tenderness.     Right lower leg: No edema.     Left lower leg: No edema.  Lymphadenopathy:     Cervical: No cervical adenopathy.  Skin:    General: Skin is warm and dry.     Coloration: Skin is not jaundiced or pale.     Findings: No bruising, erythema, lesion or rash.  Neurological:     General: No focal deficit present.     Mental Status: She is alert and oriented to person, place, and time. Mental status is at  baseline.     Cranial Nerves: No cranial nerve deficit.     Sensory: No sensory deficit.     Motor: No weakness.     Coordination: Coordination normal.     Gait: Gait normal.     Deep Tendon Reflexes: Reflexes normal.  Psychiatric:        Mood and Affect: Mood normal.        Behavior: Behavior normal.        Thought Content: Thought content normal.        Judgment: Judgment normal.      No results found.  Assessment/plan: Julie Green is a 22 y.o. female present for CPE  Exercise-induced asthma Refill albuterol for her today Cold extremities:  Iron levels collected today Routine general medical examination at a health care facility Patient was encouraged to exercise greater than 150 minutes a week. Patient was encouraged to choose a diet filled with fresh fruits and vegetables, and lean meats. AVS provided to patient today for education/recommendation on gender specific health and safety maintenance. Colonoscopy: No family history.  Routine screening 44 Mammogram: Family history of breast cancer in grandmother.  Routine screening at 39 Cervical cancer screening: no prior pap. Never SA. Immunizations: tdap 10/2023 UTD, influenza-declined(encouraged yearly) Infectious disease screening: HIV, Hep C, urine cytology - never sexually active DEXA: Routine screening  Return in about 1 year (around 10/24/2024) for cpe (20 min).    Orders Placed This Encounter  Procedures   CBC   Comprehensive metabolic panel   TSH   IBC + Ferritin   Meds ordered this encounter  Medications   albuterol (VENTOLIN HFA) 108 (90 Base) MCG/ACT inhaler    Sig: Inhale 2 puffs into the lungs every 6 (six) hours as needed for wheezing or shortness of breath.    Dispense:  1 each    Refill:  11   Referral  Orders  No referral(s) requested today     Electronically signed by: Felix Pacini, DO [facility]

## 2023-10-24 NOTE — Patient Instructions (Addendum)
Return in about 1 year (around 10/24/2024) for cpe (20 min).        Great to see you today.  I have refilled the medication(s) we provide.   If labs were collected or images ordered, we will inform you of  results once we have received them and reviewed. We will contact you either by echart message, or telephone call.  Please give ample time to the testing facility, and our office to run,  receive and review results. Please do not call inquiring of results, even if you can see them in your chart. We will contact you as soon as we are able. If it has been over 1 week since the test was completed, and you have not yet heard from [place], then please call us.    - echart message- for normal results that have been seen by the patient already.   - telephone call: abnormal results or if patient has not viewed results in their echart.  If a referral to a specialist was entered for you, please call us in 2 weeks if you have not heard from the specialist office to schedule.

## 2023-12-29 DIAGNOSIS — H16223 Keratoconjunctivitis sicca, not specified as Sjogren's, bilateral: Secondary | ICD-10-CM | POA: Diagnosis not present

## 2024-01-19 DIAGNOSIS — L209 Atopic dermatitis, unspecified: Secondary | ICD-10-CM | POA: Diagnosis not present

## 2024-01-19 DIAGNOSIS — Z5181 Encounter for therapeutic drug level monitoring: Secondary | ICD-10-CM | POA: Diagnosis not present

## 2024-03-22 DIAGNOSIS — H16223 Keratoconjunctivitis sicca, not specified as Sjogren's, bilateral: Secondary | ICD-10-CM | POA: Diagnosis not present

## 2024-05-14 DIAGNOSIS — Z0000 Encounter for general adult medical examination without abnormal findings: Secondary | ICD-10-CM | POA: Diagnosis not present

## 2024-05-17 DIAGNOSIS — K2960 Other gastritis without bleeding: Secondary | ICD-10-CM | POA: Diagnosis not present

## 2024-05-17 DIAGNOSIS — Z136 Encounter for screening for cardiovascular disorders: Secondary | ICD-10-CM | POA: Diagnosis not present

## 2024-05-17 DIAGNOSIS — R5381 Other malaise: Secondary | ICD-10-CM | POA: Diagnosis not present

## 2024-05-17 DIAGNOSIS — R5383 Other fatigue: Secondary | ICD-10-CM | POA: Diagnosis not present

## 2024-05-17 DIAGNOSIS — E638 Other specified nutritional deficiencies: Secondary | ICD-10-CM | POA: Diagnosis not present

## 2024-05-24 DIAGNOSIS — L209 Atopic dermatitis, unspecified: Secondary | ICD-10-CM | POA: Diagnosis not present

## 2024-05-28 DIAGNOSIS — K2960 Other gastritis without bleeding: Secondary | ICD-10-CM | POA: Diagnosis not present

## 2024-08-16 DIAGNOSIS — L209 Atopic dermatitis, unspecified: Secondary | ICD-10-CM | POA: Diagnosis not present

## 2024-10-26 ENCOUNTER — Encounter: Payer: BC Managed Care – PPO | Admitting: Family Medicine
